# Patient Record
Sex: Female | Born: 1970 | Race: White | Hispanic: No | Marital: Married | Smoking: Never smoker
Health system: Southern US, Community
[De-identification: ages and names within clinical notes are randomized; demographics above are authoritative.]

## PROBLEM LIST (undated history)

## (undated) DIAGNOSIS — E079 Disorder of thyroid, unspecified: Secondary | ICD-10-CM

## (undated) HISTORY — DX: Disorder of thyroid, unspecified: E07.9

## (undated) HISTORY — PX: COLONOSCOPY: SHX174

---

## 2001-02-18 ENCOUNTER — Emergency Department (HOSPITAL_COMMUNITY): Admission: EM | Admit: 2001-02-18 | Discharge: 2001-02-18 | Payer: Self-pay | Admitting: *Deleted

## 2001-02-18 ENCOUNTER — Encounter: Payer: Self-pay | Admitting: *Deleted

## 2002-01-08 ENCOUNTER — Other Ambulatory Visit: Admission: RE | Admit: 2002-01-08 | Discharge: 2002-01-08 | Payer: Self-pay | Admitting: Dermatology

## 2004-02-10 ENCOUNTER — Emergency Department (HOSPITAL_COMMUNITY): Admission: EM | Admit: 2004-02-10 | Discharge: 2004-02-10 | Payer: Self-pay | Admitting: Emergency Medicine

## 2004-02-21 ENCOUNTER — Ambulatory Visit (HOSPITAL_COMMUNITY): Admission: RE | Admit: 2004-02-21 | Discharge: 2004-02-21 | Payer: Self-pay | Admitting: Family Medicine

## 2004-06-05 ENCOUNTER — Ambulatory Visit (HOSPITAL_COMMUNITY): Admission: RE | Admit: 2004-06-05 | Discharge: 2004-06-05 | Payer: Self-pay | Admitting: Family Medicine

## 2004-06-16 ENCOUNTER — Ambulatory Visit (HOSPITAL_COMMUNITY): Admission: RE | Admit: 2004-06-16 | Discharge: 2004-06-16 | Payer: Self-pay | Admitting: Colon and Rectal Surgery

## 2004-06-16 IMAGING — CT CT PARANASAL SINUSES LIMITED
1 series · 8 of 10 positions shown, 10 images · IV contrast (agent unspecified)
Comparison: none

HISTORY: Chronic sinusitis

CT SINUSES LIMITED WITHOUT CONTRAST:
Coronal noncontrast CT images the paranasal sinuses performed at 10 mm
intervals.
Nasal septum midline.
Mild beam hardening artifacts from dental fillings.
Right side of face marked with BB.
Paranasal sinuses clear.
No fracture or bone destruction.

[Series 1610: — · axial · 0.33mm/px · z∈[-615,-545]mm · 8 of 10 slices shown, 10 images]
[im 2/10  brain]
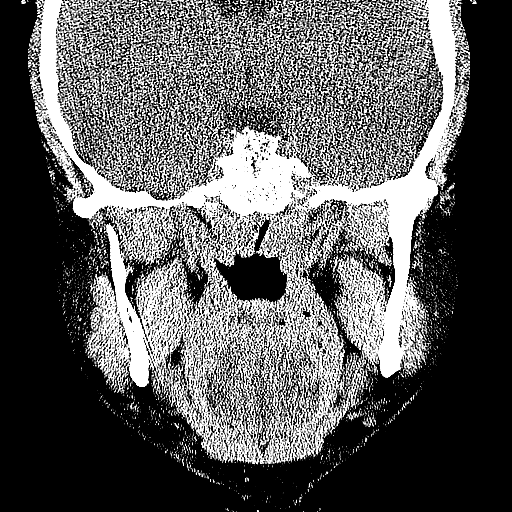
[im 2/10  bone]
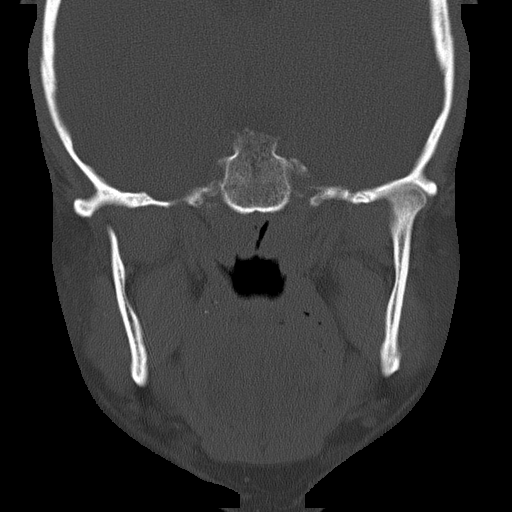
[im 3/10  bone]
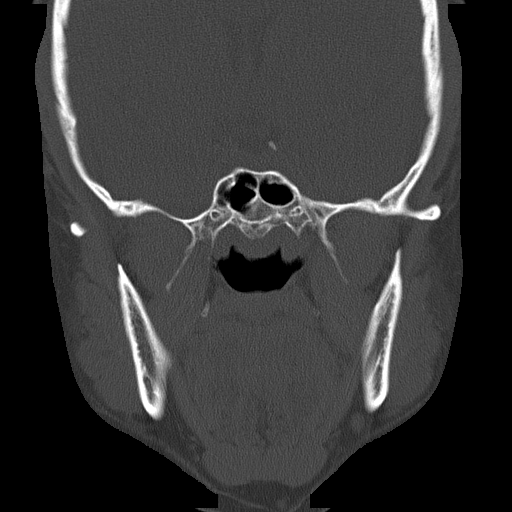
[im 4/10  bone]
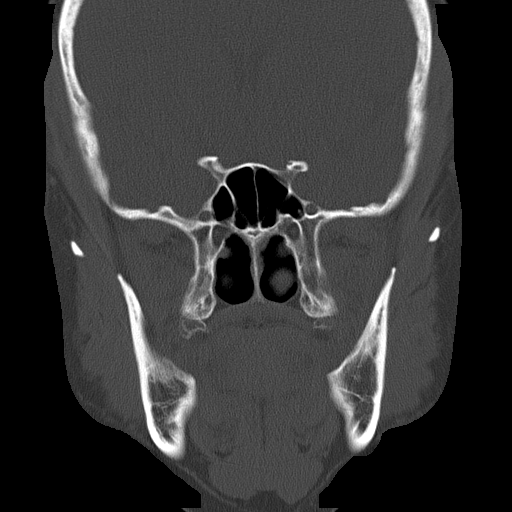
[im 5/10  bone]
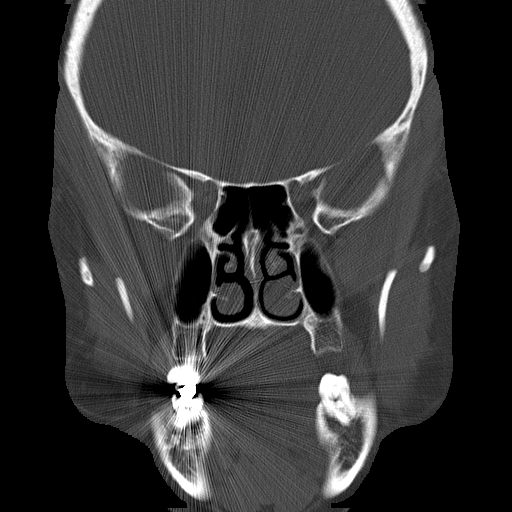
[im 6/10  brain]
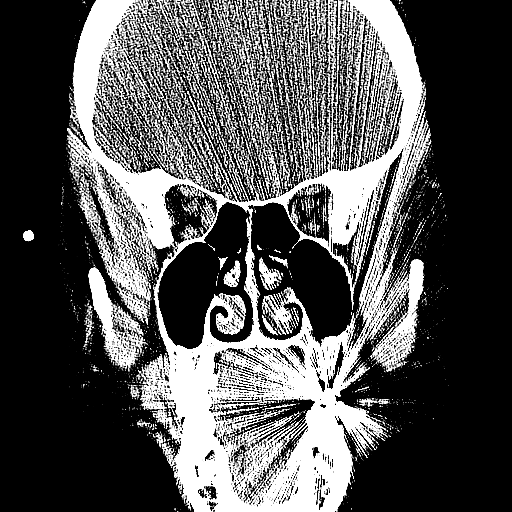
[im 6/10  bone]
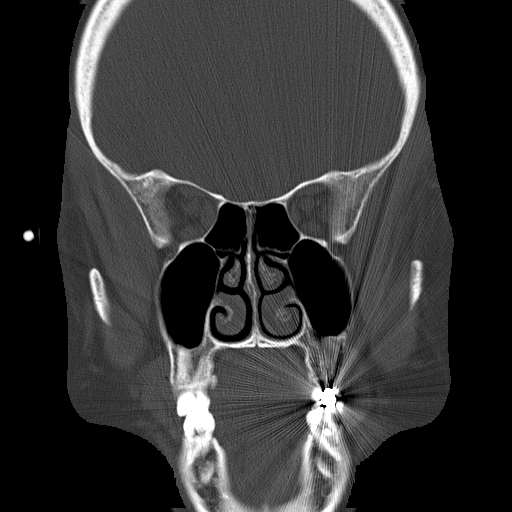
[im 7/10  bone]
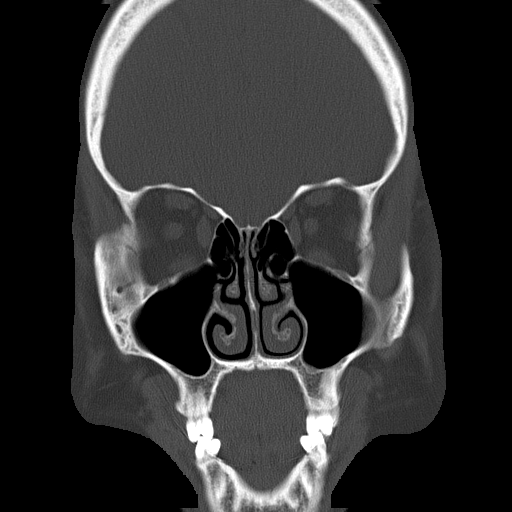
[im 8/10  bone]
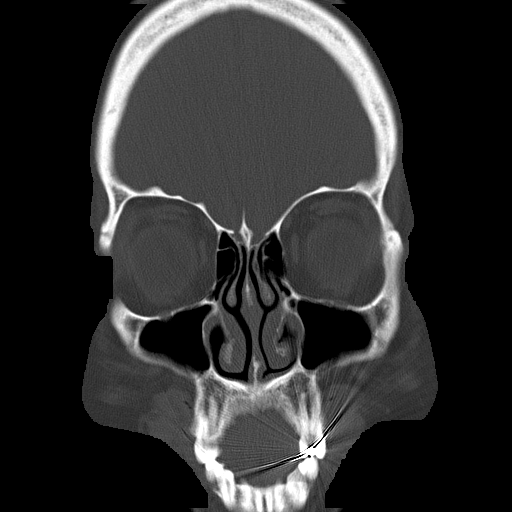
[im 9/10  bone]
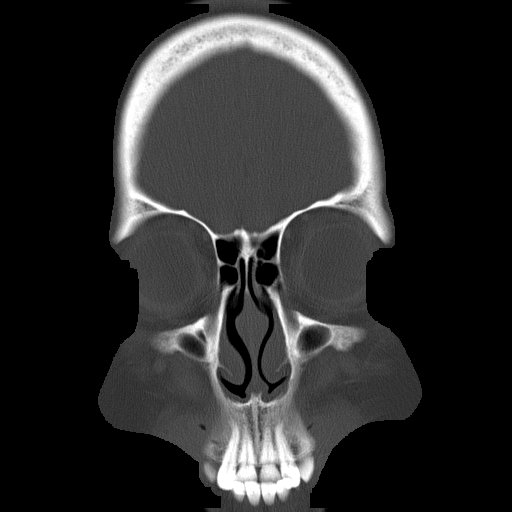

[8 of 10 positions shown; findings below may reference images not displayed]

IMPRESSION: Negative limited CT of the paranasal sinuses.

## 2009-02-17 ENCOUNTER — Ambulatory Visit (HOSPITAL_COMMUNITY): Admission: RE | Admit: 2009-02-17 | Discharge: 2009-02-17 | Payer: Self-pay | Admitting: Family Medicine

## 2009-02-17 IMAGING — CR DG CHEST 2V
2 series · 2 of 2 positions shown · non-contrast
Comparison: [DATE]

CLINICAL DATA: Cough

CHEST - 2 VIEW

[view not recorded (1 of 2)]
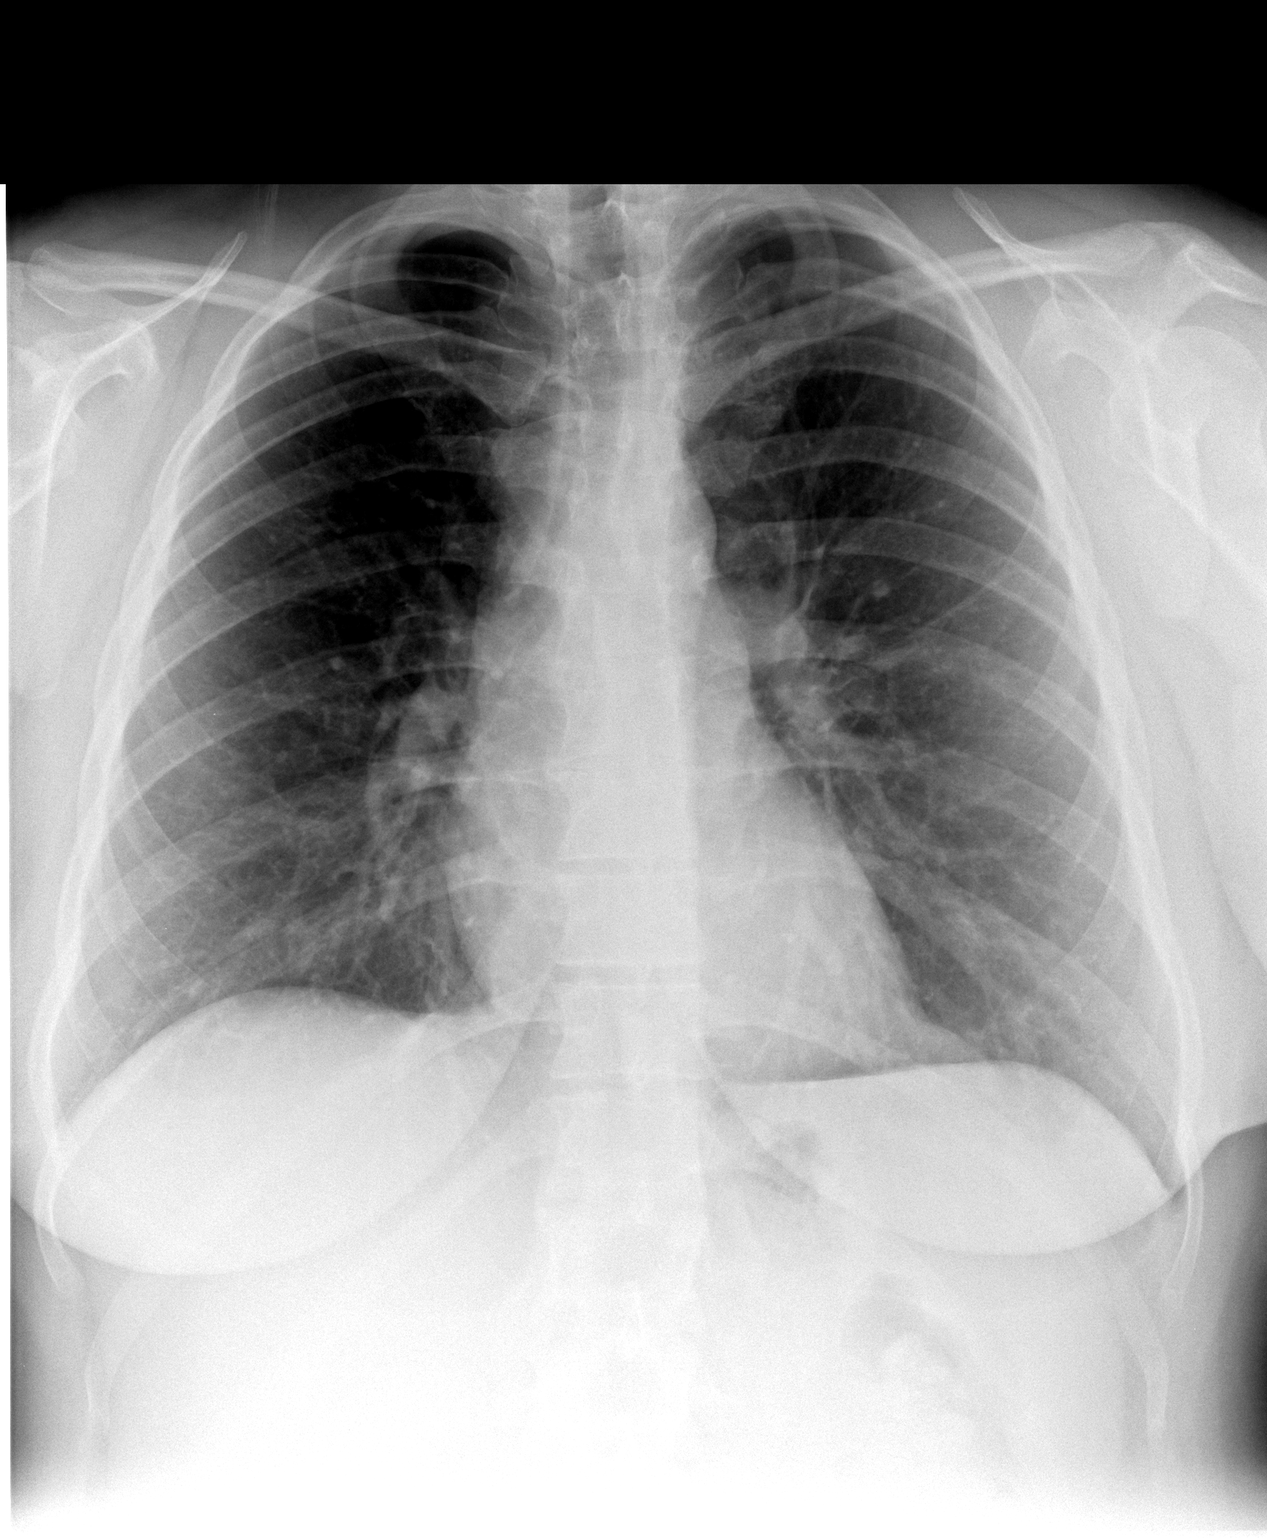

[view not recorded (2 of 2)]
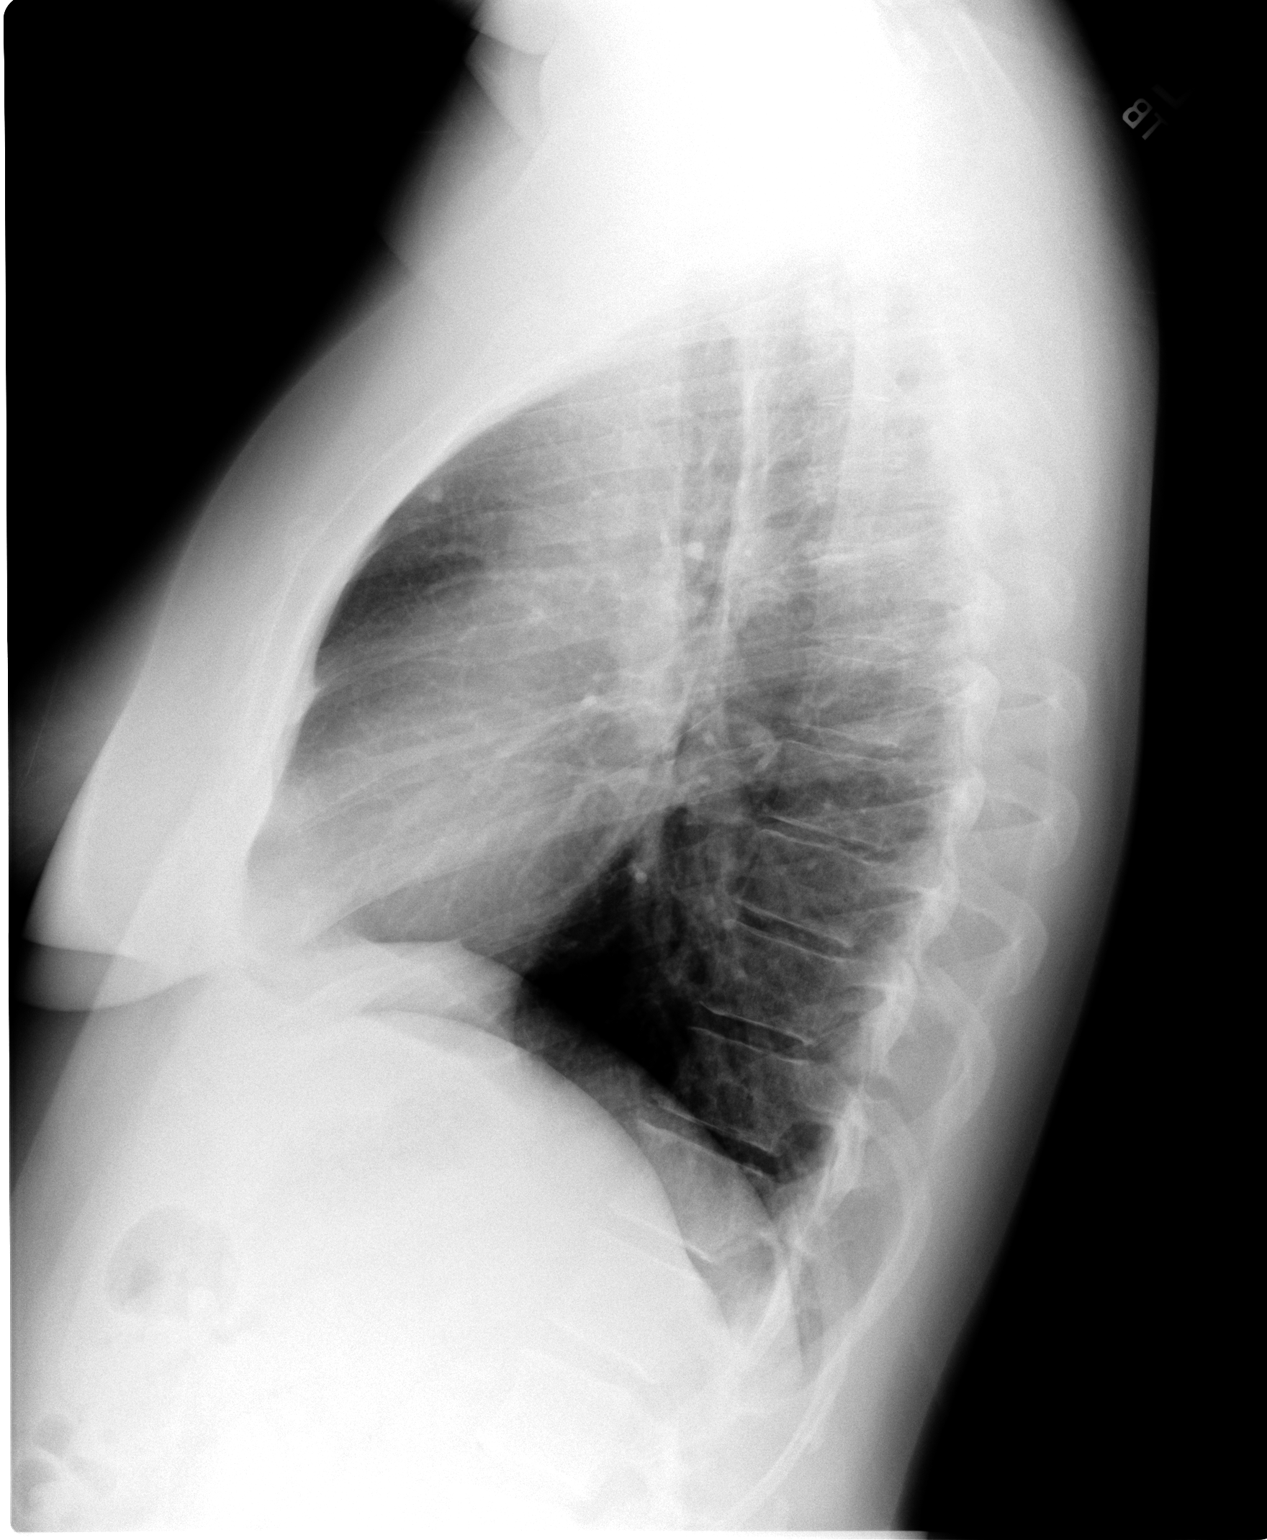

[2 of 2 positions shown; findings below may reference images not displayed]

FINDINGS: No infiltrate consolidation or atelectasis.  Single small
granulomas in the upper lobes bilaterally.  Normal
cardiomediastinal silhouette.
IMPRESSION: No acute chest findings.  Old granulomatous disease.a

## 2010-01-13 ENCOUNTER — Ambulatory Visit (HOSPITAL_COMMUNITY): Admission: RE | Admit: 2010-01-13 | Discharge: 2010-01-13 | Payer: Self-pay | Admitting: Internal Medicine

## 2010-01-13 IMAGING — CR DG HUMERUS 2V *L*
2 series · 2 of 2 positions shown · non-contrast
Comparison: None.

CLINICAL DATA: Fall, arm pain.

LEFT HUMERUS - 2+ VIEW

[view not recorded (1 of 2)]
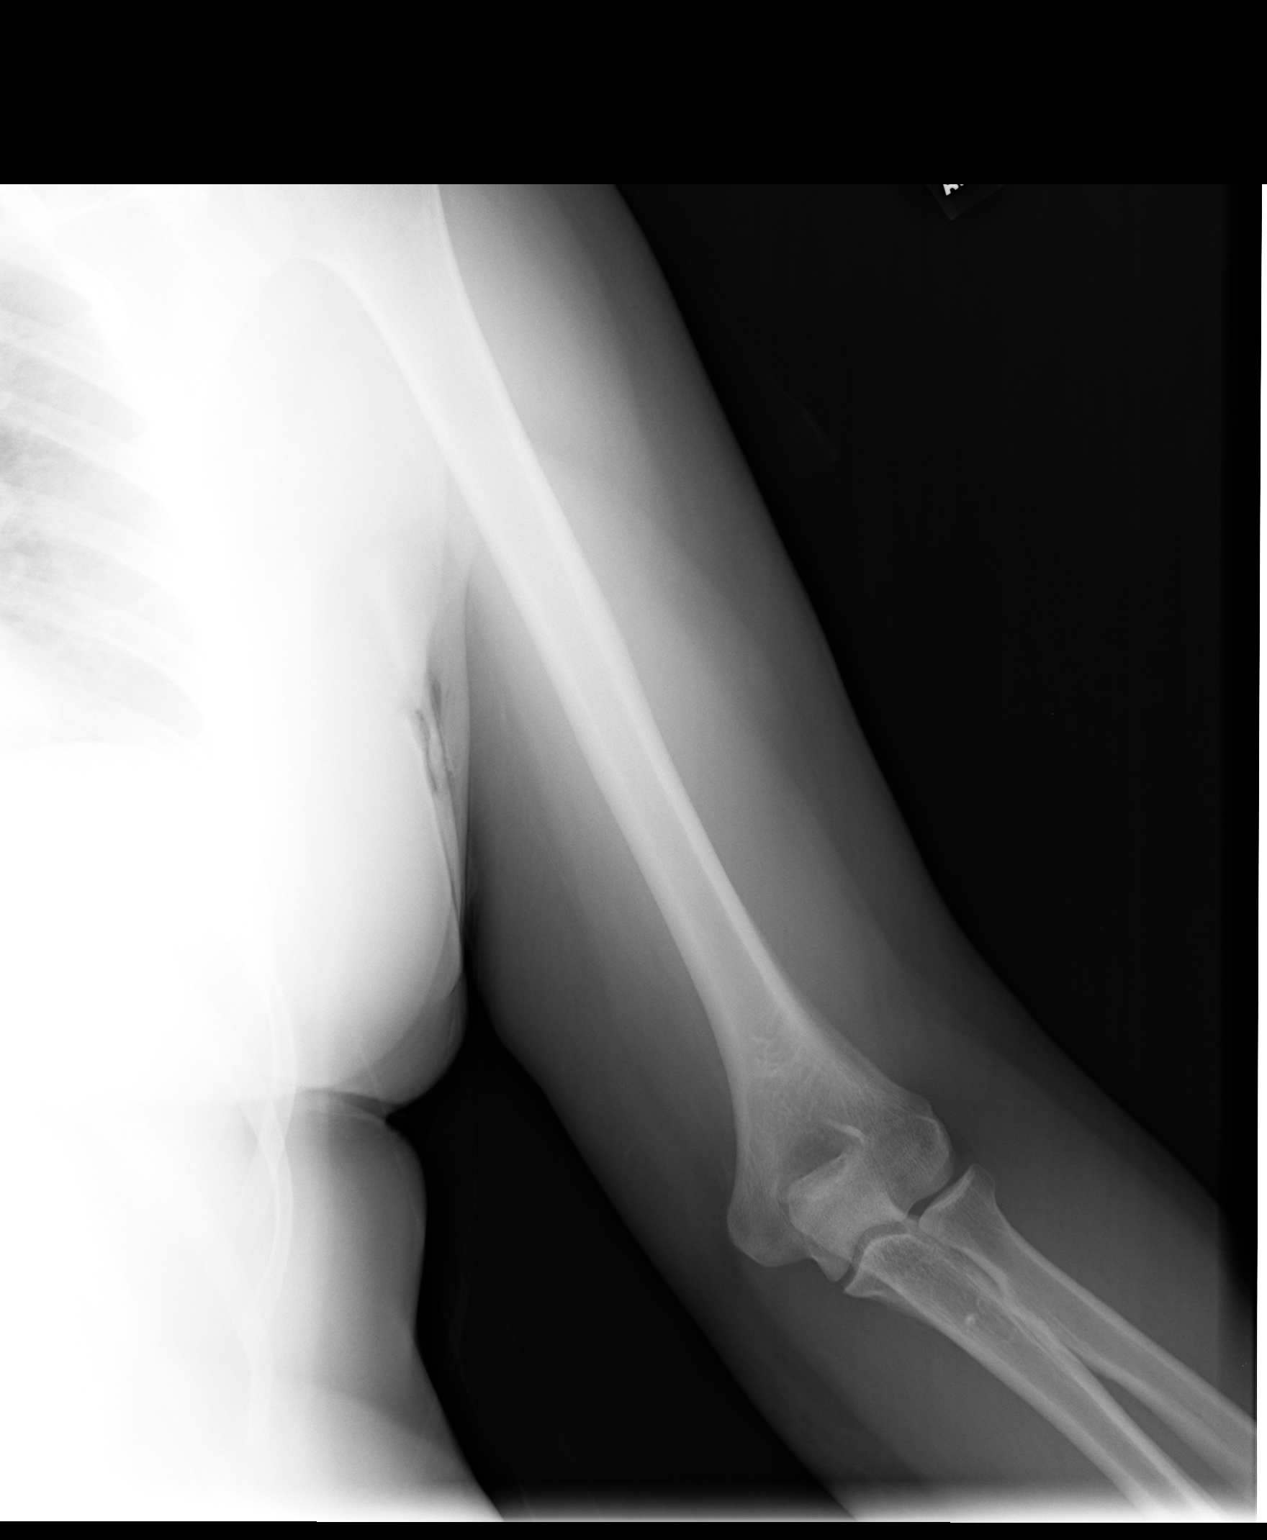

[view not recorded (2 of 2)]
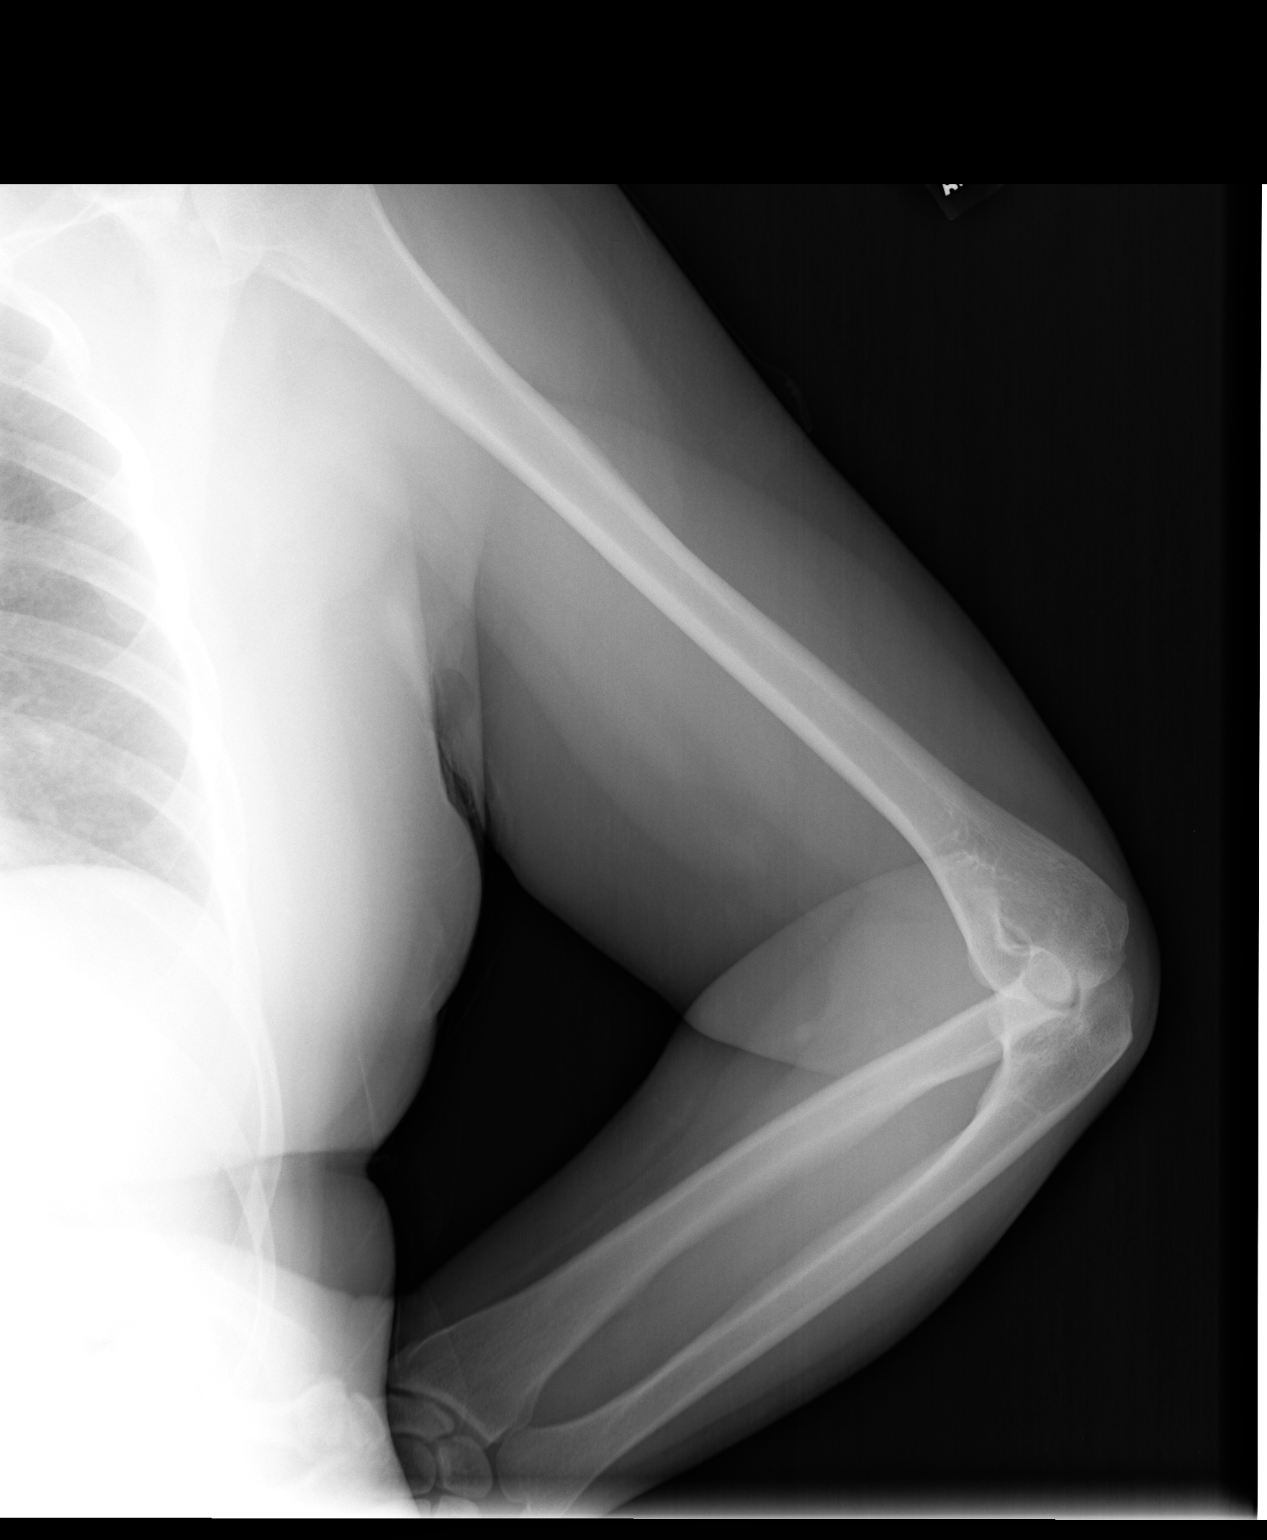

[2 of 2 positions shown; findings below may reference images not displayed]

FINDINGS: No acute bony or joint abnormality is identified.  There
are some calcifications off the greater tuberosity of the humerus
compatible with either calcific tendinopathy or calcific bursitis.
IMPRESSION: 1.  No acute finding.
2.  Findings consistent with calcific rotator cuff tendinopathy
versus calcific bursitis.

## 2018-09-01 DIAGNOSIS — E282 Polycystic ovarian syndrome: Secondary | ICD-10-CM | POA: Insufficient documentation

## 2018-09-01 DIAGNOSIS — Z9189 Other specified personal risk factors, not elsewhere classified: Secondary | ICD-10-CM | POA: Insufficient documentation

## 2018-09-03 ENCOUNTER — Other Ambulatory Visit: Payer: Self-pay | Admitting: Obstetrics and Gynecology

## 2018-09-03 DIAGNOSIS — R928 Other abnormal and inconclusive findings on diagnostic imaging of breast: Secondary | ICD-10-CM

## 2018-09-12 ENCOUNTER — Other Ambulatory Visit: Payer: Self-pay

## 2018-09-12 ENCOUNTER — Ambulatory Visit
Admission: RE | Admit: 2018-09-12 | Discharge: 2018-09-12 | Disposition: A | Discharge status: Home / Self Care | Payer: 59 | Source: Ambulatory Visit | Attending: Obstetrics and Gynecology | Admitting: Obstetrics and Gynecology

## 2018-09-12 DIAGNOSIS — R928 Other abnormal and inconclusive findings on diagnostic imaging of breast: Secondary | ICD-10-CM

## 2018-09-12 IMAGING — US ULTRASOUND LEFT BREAST LIMITED
1 series · 9 of 9 positions shown · non-contrast
Comparison: Previous exam(s).

CLINICAL DATA: The patient was called back for a left breast mass.

EXAM:
DIGITAL DIAGNOSTIC LEFT MAMMOGRAM WITH TOMO
ULTRASOUND LEFT BREAST

[Series 1: ultrasound left breast limited · 0.06mm/px · 9 of 9 slices shown]
[im 1/9]
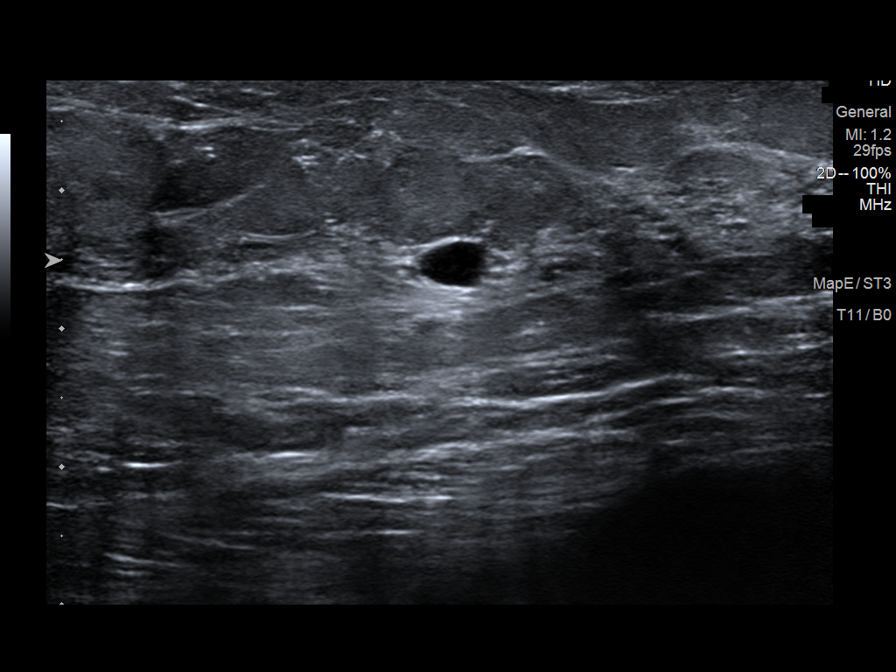
[im 2/9]
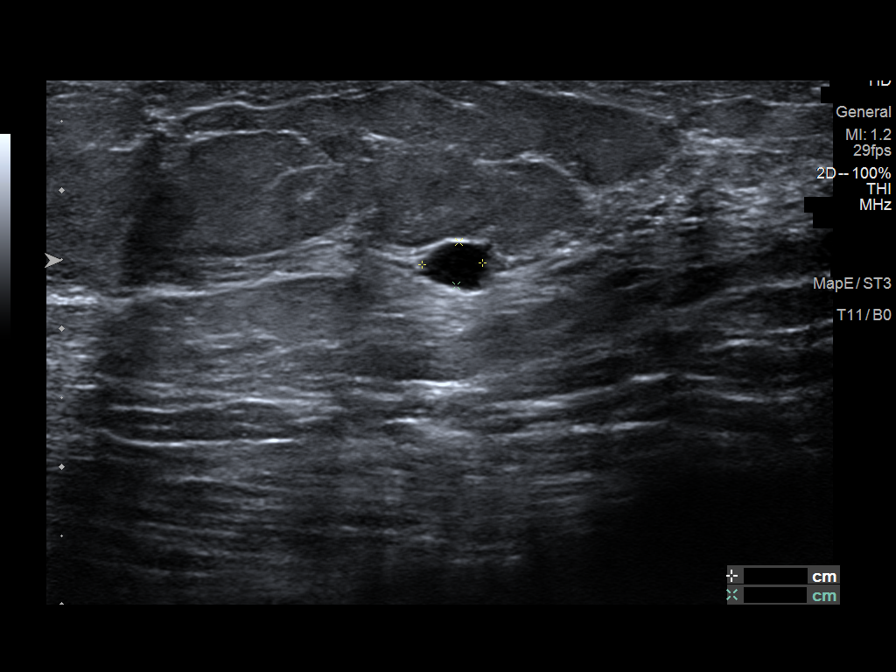
[im 3/9]
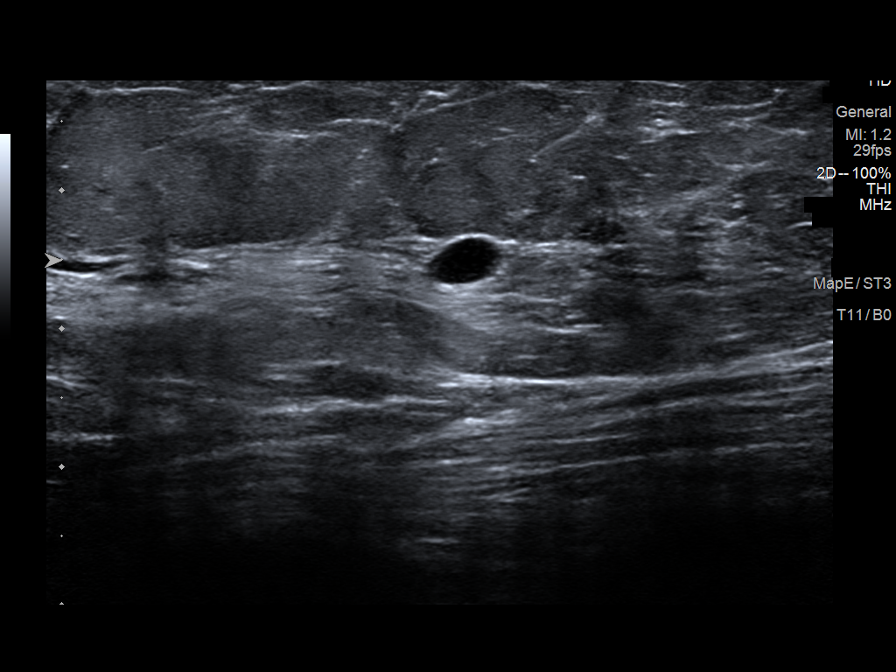
[im 4/9]
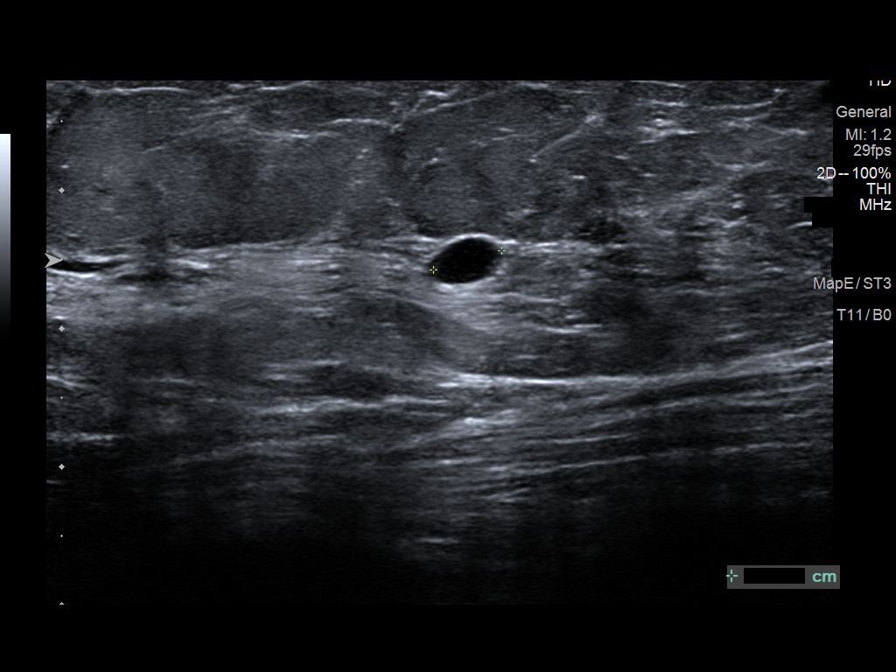
[im 5/9]
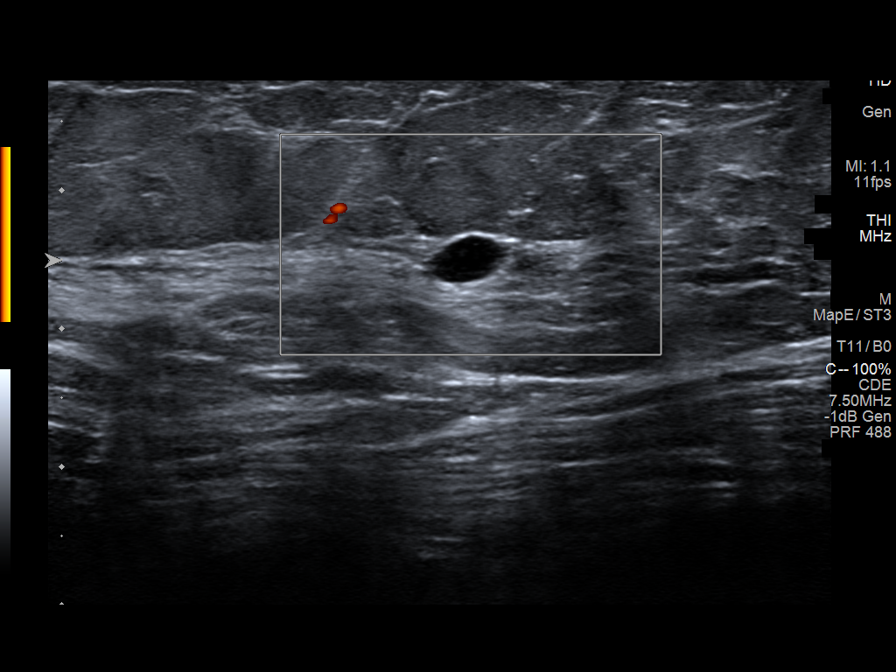
[im 6/9]
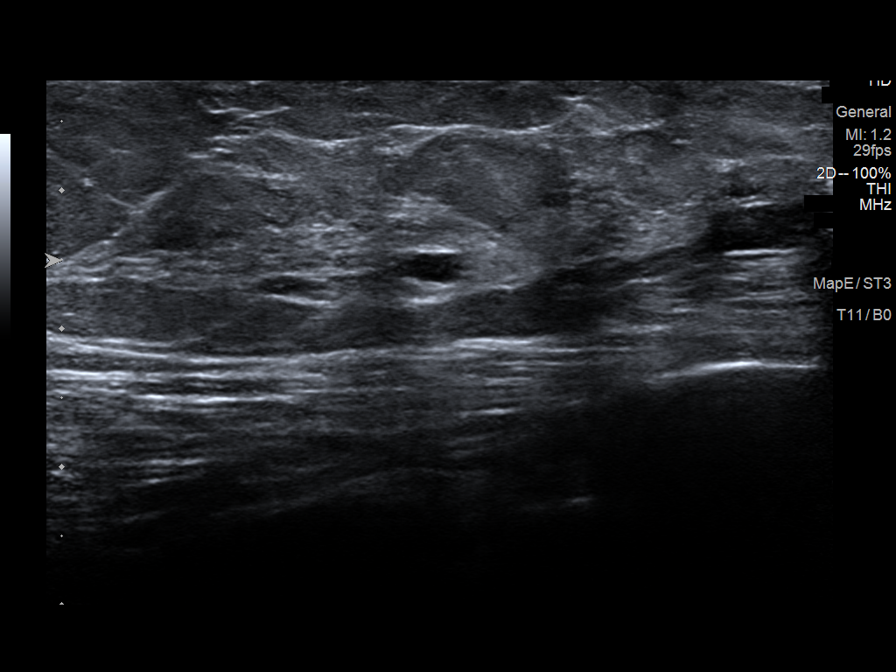
[im 7/9]
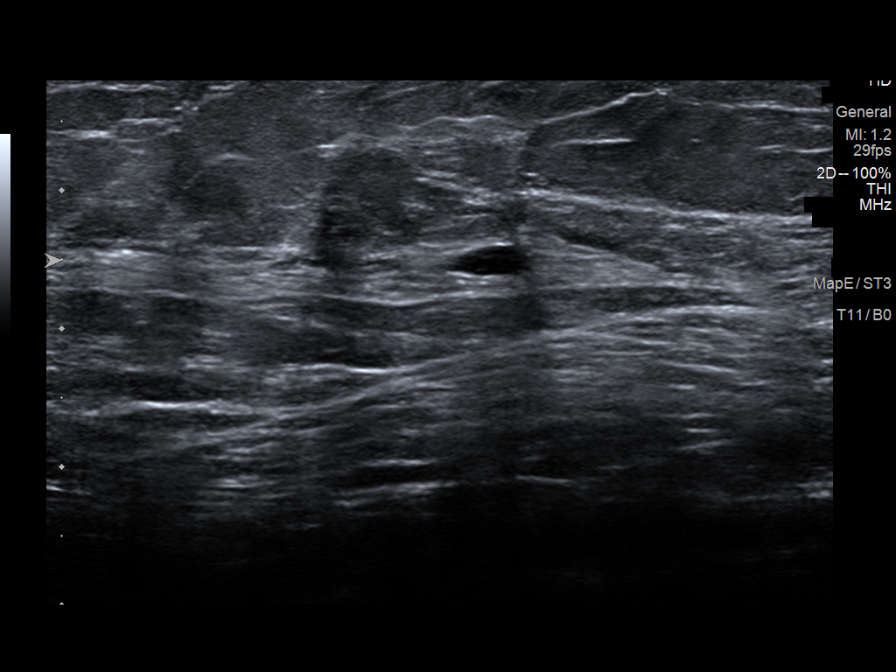
[im 8/9]
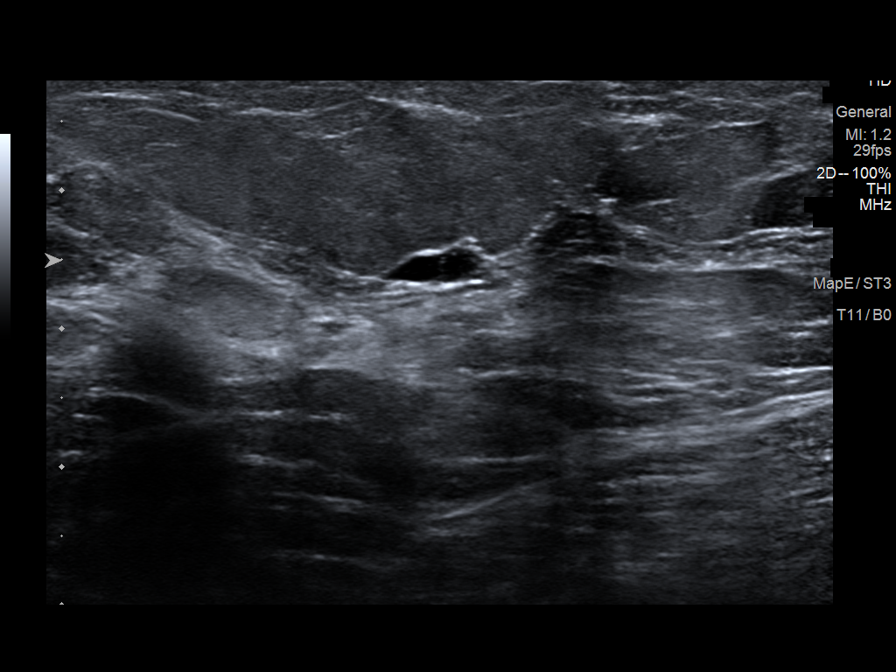
[im 9/9]
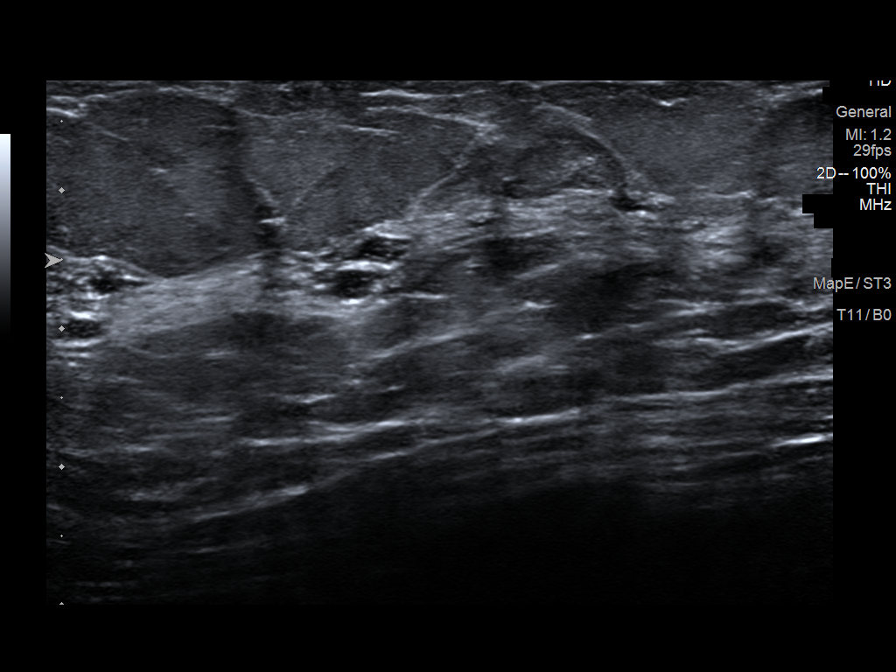

[9 of 9 positions shown; findings below may reference images not displayed]

ACR Breast Density Category b: There are scattered areas of
fibroglandular density.
FINDINGS: The left breast mass is less conspicuous but does persist on today's
imaging.

On physical exam, no suspicious lumps are identified.

Targeted ultrasound is performed, showing numerous cysts in the
upper outer left breast accounting for the mammographically
identified mass.
IMPRESSION: Fibrocystic changes.  No evidence of malignancy.

RECOMMENDATION:
Annual screening mammography. Additionally, the patient's sister was
recently diagnosed with breast cancer and was noted to be
genetically positive. The patient has had genetic testing and is
awaiting results. If the patient is genetically positive for a
breast cancer mutation, recommend annual breast MRI in addition to
annual mammography. If the patient is negative for a genetic
mutation, recommend assessing the patient's lifetime risk of breast
cancer. If her lifetime risk of breast cancer is greater than 20%,
recommend annual breast MRI in addition to annual mammography.

I have discussed the findings and recommendations with the patient.
Results were also provided in writing at the conclusion of the
visit. If applicable, a reminder letter will be sent to the patient
regarding the next appointment.

BI-RADS CATEGORY  2: Benign.

## 2018-09-12 IMAGING — MG DIGITAL DIAGNOSTIC UNILATERAL LEFT MAMMOGRAM WITH TOMO AND CAD
4 series · 4 of 12 positions shown · non-contrast
Comparison: Previous exam(s).

CLINICAL DATA: The patient was called back for a left breast mass.

EXAM:
DIGITAL DIAGNOSTIC LEFT MAMMOGRAM WITH TOMO
ULTRASOUND LEFT BREAST

[L CC synth-2D]
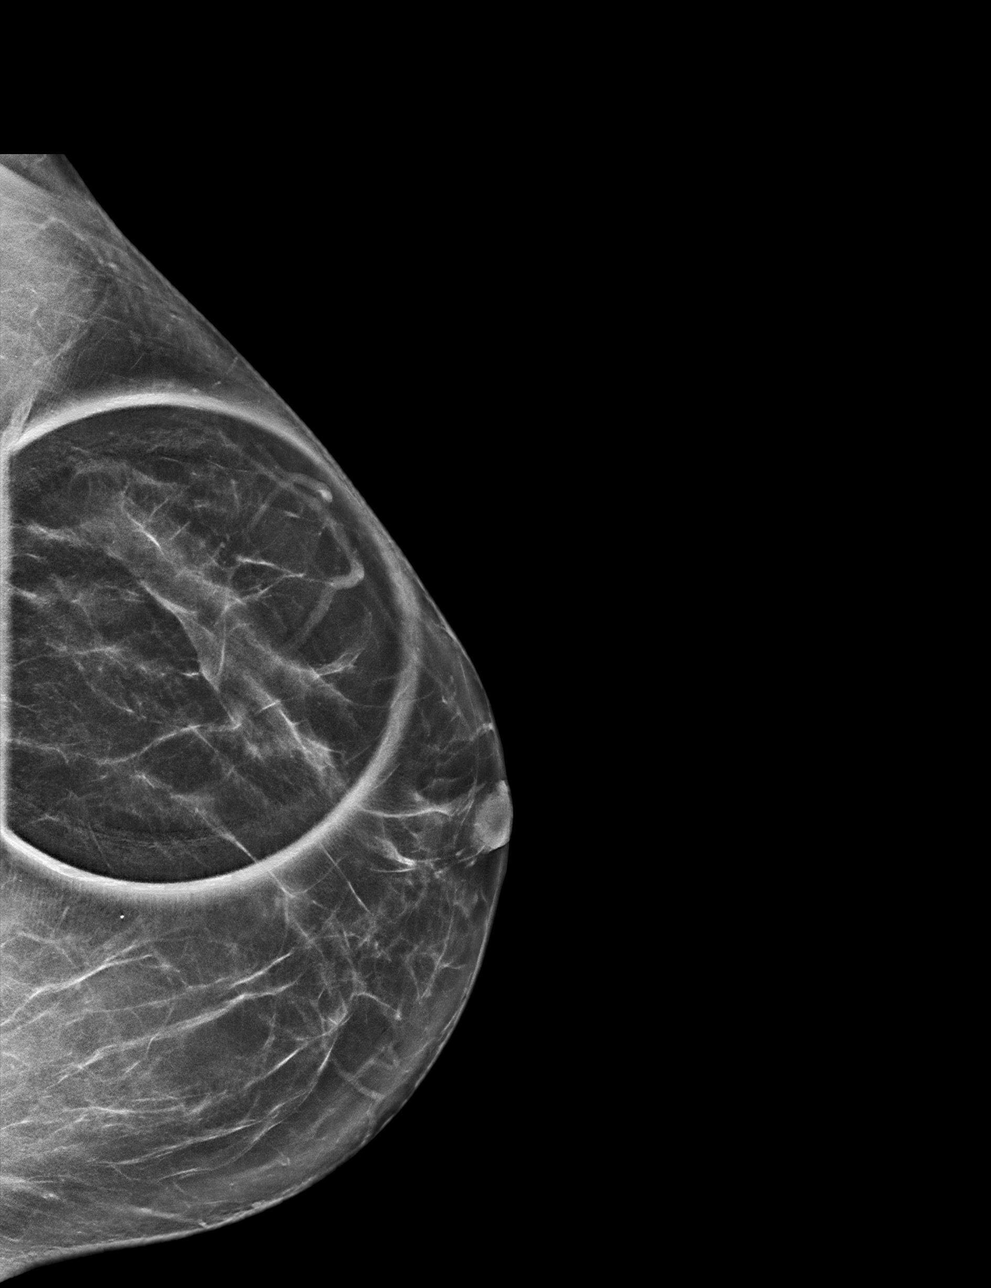

[L MLO synth-2D]
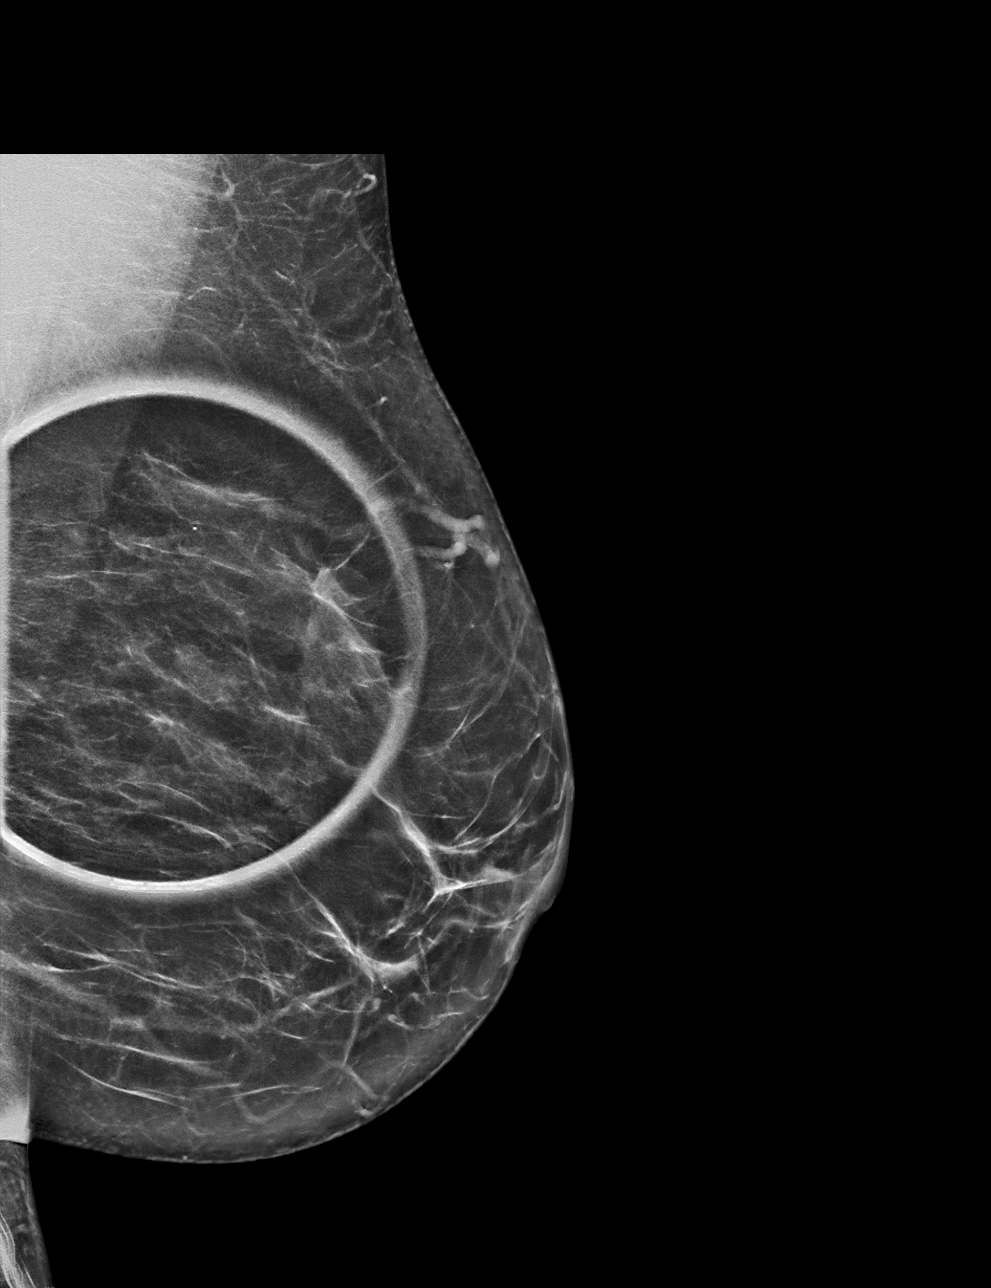

[L MLO tomo · tomo slice 37/73.0]
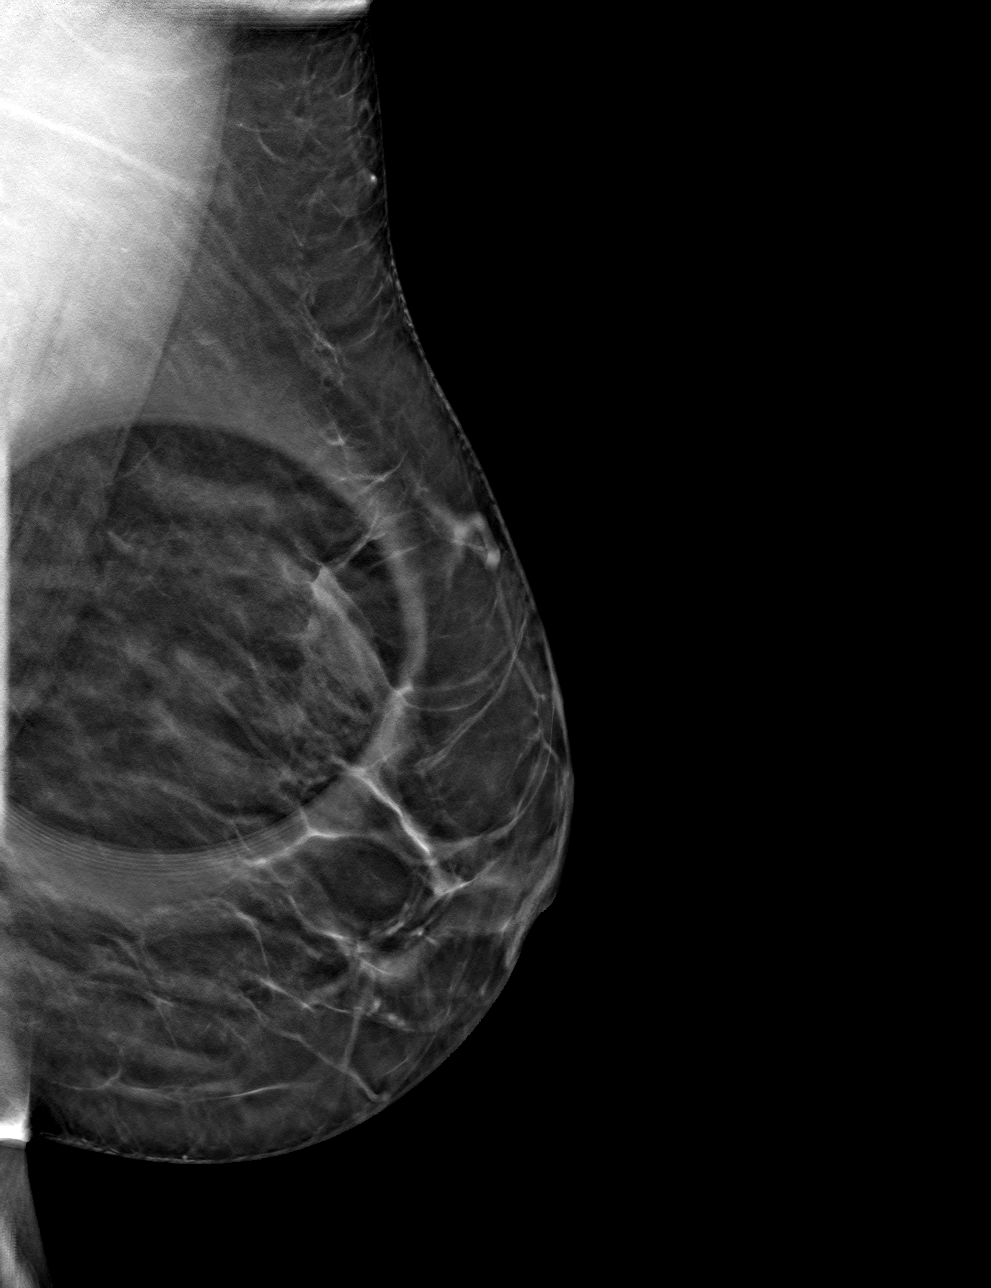

[L CC tomo · tomo slice 31/62.0]
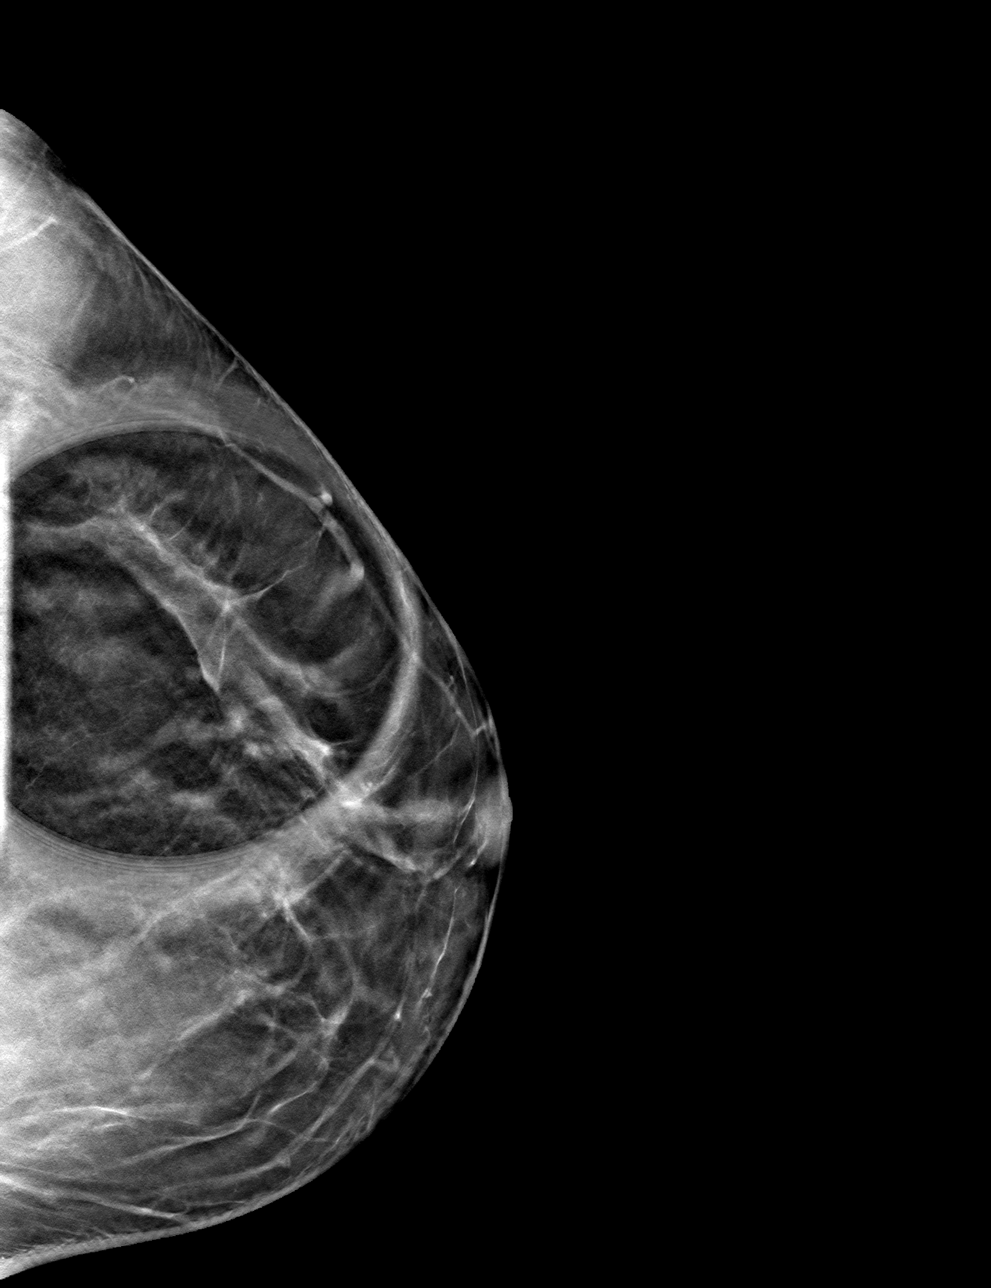

[4 of 12 positions shown; findings below may reference images not displayed]

ACR Breast Density Category b: There are scattered areas of
fibroglandular density.
FINDINGS: The left breast mass is less conspicuous but does persist on today's
imaging.

On physical exam, no suspicious lumps are identified.

Targeted ultrasound is performed, showing numerous cysts in the
upper outer left breast accounting for the mammographically
identified mass.
IMPRESSION: Fibrocystic changes.  No evidence of malignancy.

RECOMMENDATION:
Annual screening mammography. Additionally, the patient's sister was
recently diagnosed with breast cancer and was noted to be
genetically positive. The patient has had genetic testing and is
awaiting results. If the patient is genetically positive for a
breast cancer mutation, recommend annual breast MRI in addition to
annual mammography. If the patient is negative for a genetic
mutation, recommend assessing the patient's lifetime risk of breast
cancer. If her lifetime risk of breast cancer is greater than 20%,
recommend annual breast MRI in addition to annual mammography.

I have discussed the findings and recommendations with the patient.
Results were also provided in writing at the conclusion of the
visit. If applicable, a reminder letter will be sent to the patient
regarding the next appointment.

BI-RADS CATEGORY  2: Benign.

## 2018-09-16 DIAGNOSIS — N859 Noninflammatory disorder of uterus, unspecified: Secondary | ICD-10-CM | POA: Insufficient documentation

## 2018-10-23 ENCOUNTER — Other Ambulatory Visit: Payer: Self-pay | Admitting: Obstetrics and Gynecology

## 2018-10-23 DIAGNOSIS — Z9189 Other specified personal risk factors, not elsewhere classified: Secondary | ICD-10-CM

## 2019-01-16 ENCOUNTER — Other Ambulatory Visit: Payer: Self-pay

## 2019-01-16 ENCOUNTER — Ambulatory Visit
Admission: RE | Admit: 2019-01-16 | Discharge: 2019-01-16 | Disposition: A | Discharge status: Home / Self Care | Payer: 59 | Source: Ambulatory Visit | Attending: Obstetrics and Gynecology | Admitting: Obstetrics and Gynecology

## 2019-01-16 DIAGNOSIS — Z9189 Other specified personal risk factors, not elsewhere classified: Secondary | ICD-10-CM

## 2019-01-16 IMAGING — MR MR BREAST BILAT WO/W CM
8 of 12 series · 32 of 48 positions shown · IV contrast (8 ml gadavist)
Comparison: Previous exam(s).

CLINICAL DATA: 47-year-old female with a strong family history of
breast cancer. Patient's sister was diagnosed at the age of 47 and
is genetically positive and paternal aunt at the age of 71. The
patient had genetic testing and is awaiting results. Patient was
called back from a recent screening mammogram performed on
[DATE] and diagnostic workup was performed revealing cysts in
the lateral breast.

LABS:  None obtained at the time of imaging.
EXAM:
BILATERAL BREAST MRI WITH AND WITHOUT CONTRAST
TECHNIQUE: Multiplanar, multisequence MR images of both breasts were obtained
prior to and following the intravenous administration of 8 ml of
Gadavist

[Series 2: t2_tirm_tra ipat (a-p) · axial · 3.0mm · 0.70mm/px · 1 of 55 slices shown]
[im 1/55]
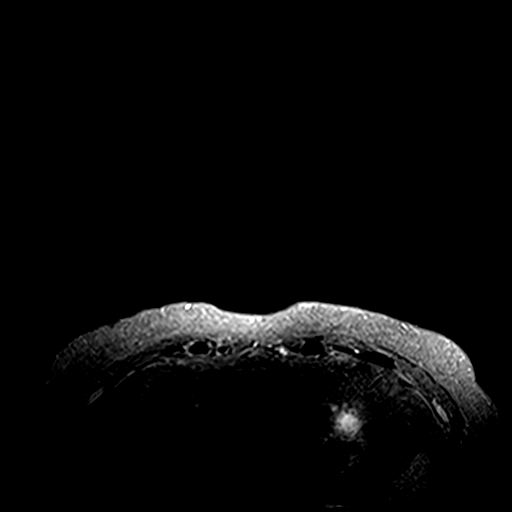

[Series 3: fl3d pre-cm no · axial · non-contrast · 1.2mm · 0.94mm/px · z∈[-57,+115]mm · 5 of 144 slices shown]
[im 1/144]
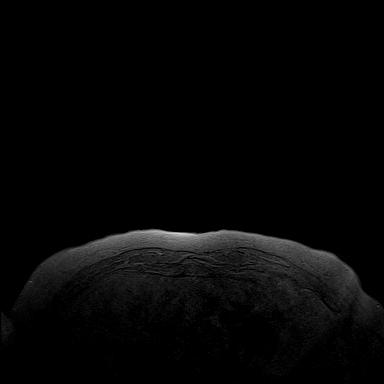
[im 36/144]
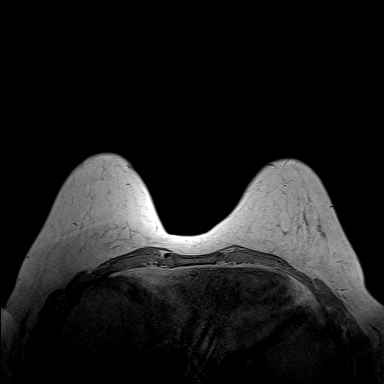
[im 72/144]
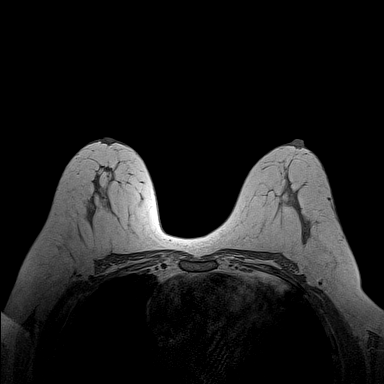
[im 108/144]
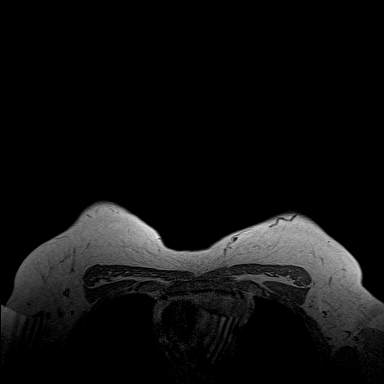
[im 144/144]
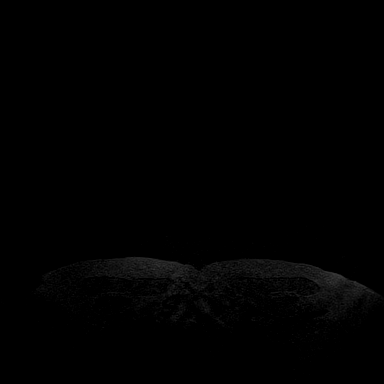

[Series 4: fl3d pre-cm · axial · non-contrast · 1.2mm · 0.94mm/px · z∈[-57,+115]mm · 5 of 144 slices shown]
[im 1/144]
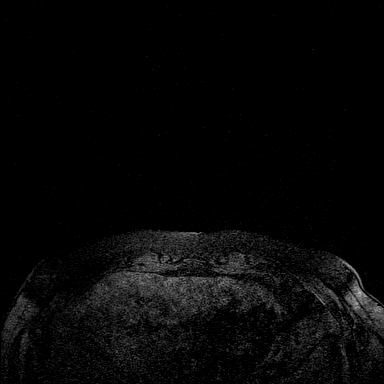
[im 36/144]
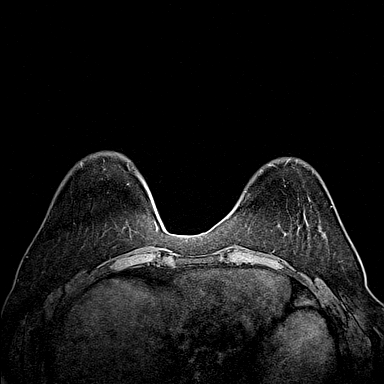
[im 72/144]
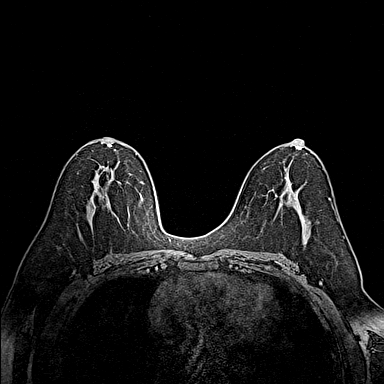
[im 108/144]
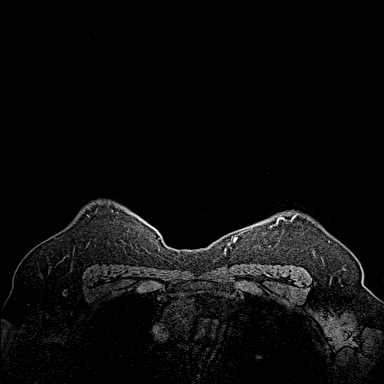
[im 144/144]
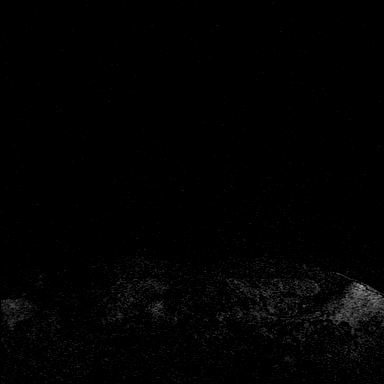

[Series 5: fl3d post-cm 20 · axial · 1.2mm · 0.94mm/px · z∈[-57,+115]mm · 5 of 144 slices shown (1 of 3)]
[im 1/144]
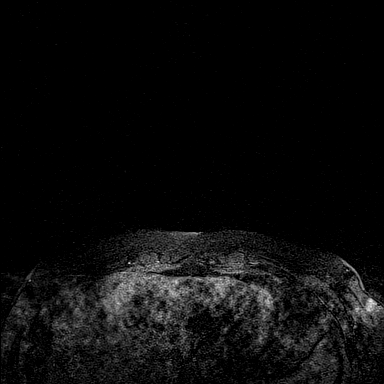
[im 36/144]
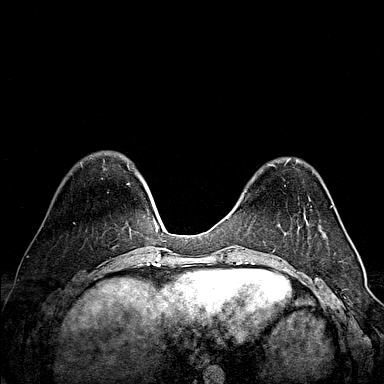
[im 72/144]
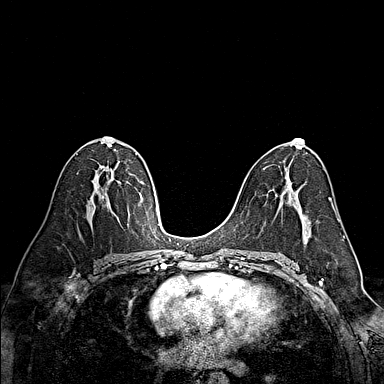
[im 108/144]
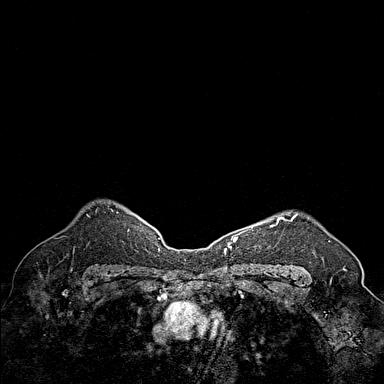
[im 144/144]
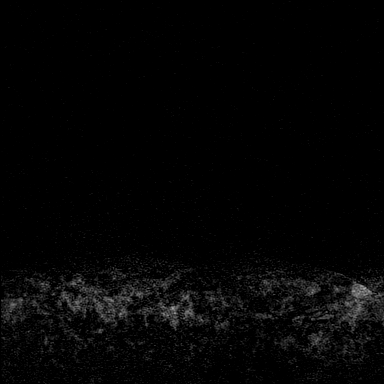

[Series 6: fl3d post-cm 20 · axial · 1.2mm · 0.94mm/px · z∈[-57,+115]mm · 5 of 144 slices shown (2 of 3)]
[im 1/144]
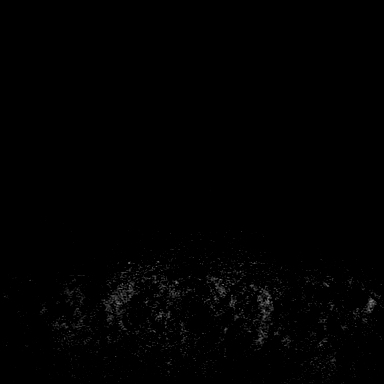
[im 36/144]
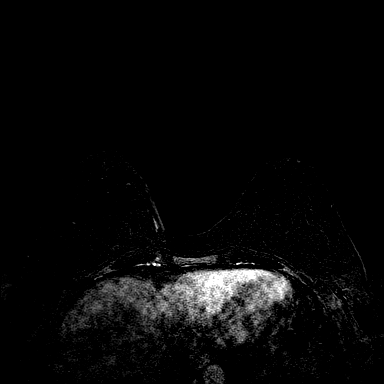
[im 72/144]
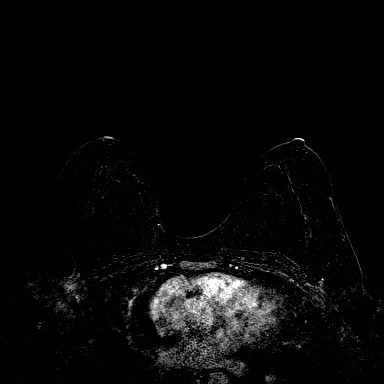
[im 108/144]
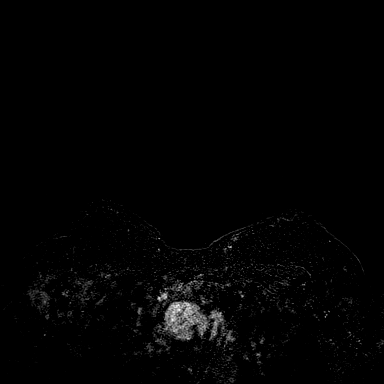
[im 144/144]
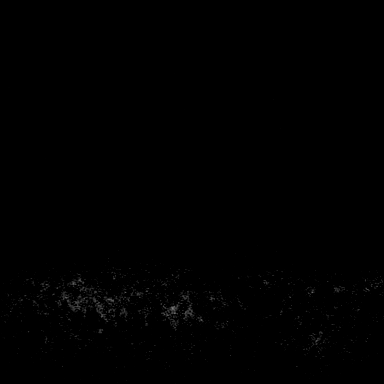

[Series 7: fl3d post-cm 20 · axial · 172.8mm · 0.94mm/px · 1 of 1 slices shown (3 of 3)]
[im 1/1]
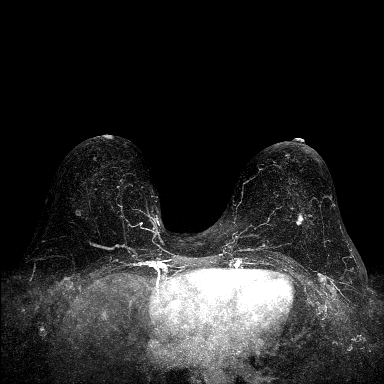

[Series 8: fl3d post-cm 3min · axial · 1.2mm · 0.94mm/px · z∈[-57,+115]mm · 6 of 144 slices shown]
[im 1/144]
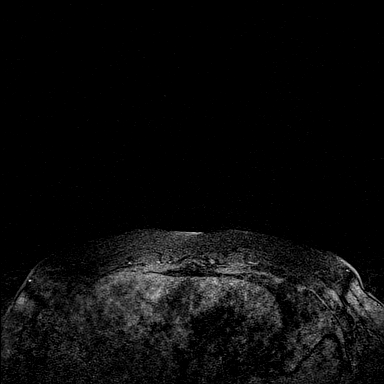
[im 29/144]
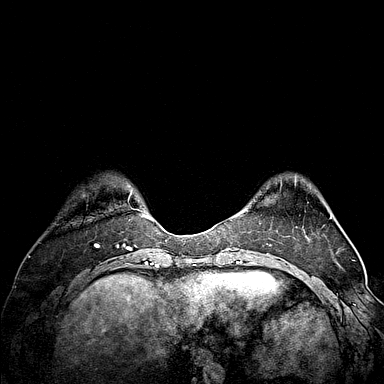
[im 58/144]
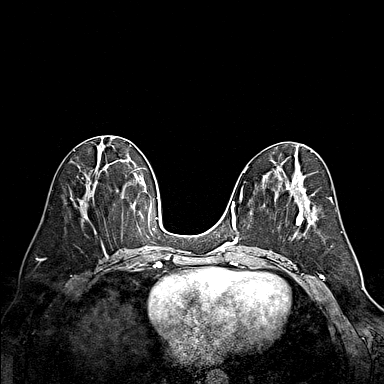
[im 86/144]
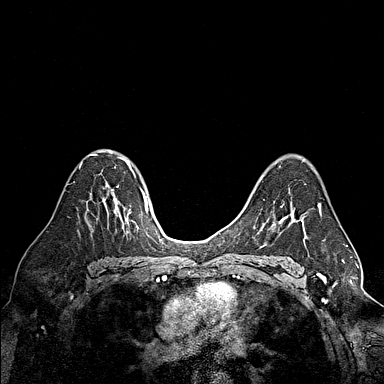
[im 115/144]
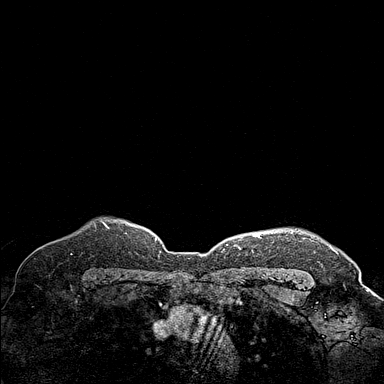
[im 144/144]
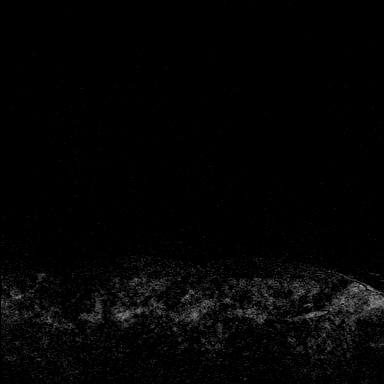

[Series 9: fl3d post-cm 3min_sub · axial · 1.2mm · 0.94mm/px · z∈[-57,+45]mm · 4 of 144 slices shown]
[im 1/144]
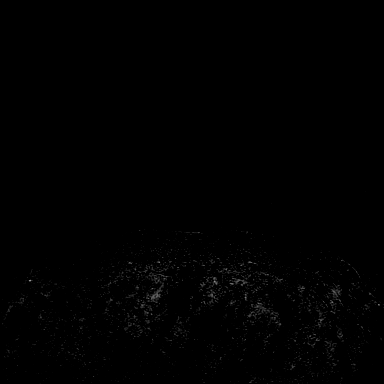
[im 29/144]
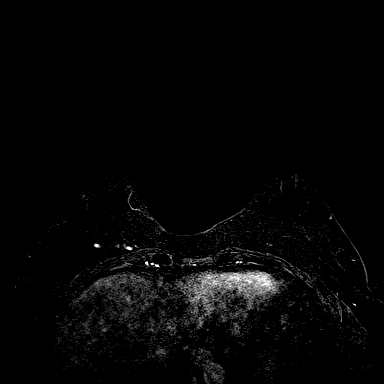
[im 58/144]
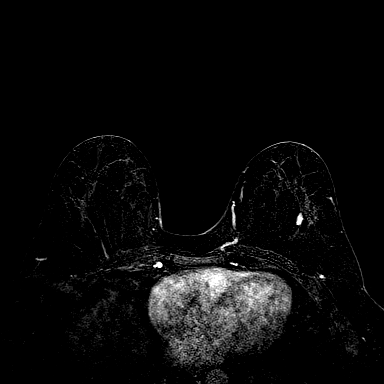
[im 86/144]
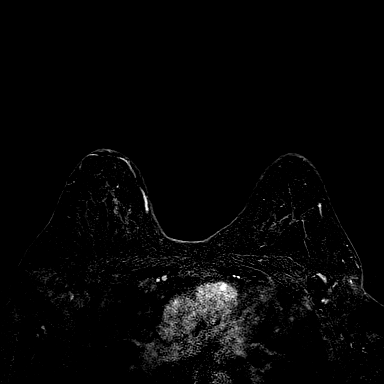

[32 of 48 positions shown; findings below may reference images not displayed]

Three-dimensional MR images were rendered by post-processing of the
original MR data on an independent workstation. The
three-dimensional MR images were interpreted, and findings are
reported in the following complete MRI report for this study. Three
dimensional images were evaluated at the independent [employer]
workstation
FINDINGS: Breast composition: b. Scattered fibroglandular tissue.

Background parenchymal enhancement: Moderate.

Right breast:

1. There is 7 mm enhancing mass in the middle third of the lower
outer quadrant of the right breast (series 9, image 93).

2. There is a well-circumscribed 4 mm indeterminate nodule in the
anterior third of the lower outer quadrant of the right breast
(series 9, image 94).

Left breast:

1. There is a 1.3 cm enhancing mass in the middle third of the lower
outer quadrant of the left breast (series 9, image 90).

2. There is a 4 mm enhancing mass in the middle third of the lower
outer quadrant of the left breast (series 9, image 99) with
associated smaller enhancing nodularity, medially.

3. There is a 4 mm enhancing mass in the anterior third of the lower
inner quadrant of the left breast (series 9, image 90).

Lymph nodes: No abnormal appearing lymph nodes.

Ancillary findings:  None.
IMPRESSION: Indeterminate enhancing masses in both breast.

RECOMMENDATION:
Recommend MR guided core biopsies of the larger masses in each
breast (the 1.3 cm mass in the lower outer quadrant of the left
breast seen on series 9, image 90 and the 7 mm enhancing mass in the
lower outer quadrant of the right breast seen on series 9, image 93.

If the MR guided core biopsies are benign short-term interval
follow-up breast MRI in 6 months is recommended. If either of the
masses are malignant consider MR guided core biopsies of other
enhancing masses in both breast.

BI-RADS CATEGORY  4: Suspicious.

## 2019-01-16 MED ORDER — GADOBUTROL 1 MMOL/ML IV SOLN
8.0000 mL | Freq: Once | INTRAVENOUS | Status: AC | PRN
  Administered 2019-01-16: 12:00:00 8 mL via INTRAVENOUS

## 2019-01-19 ENCOUNTER — Other Ambulatory Visit: Payer: Self-pay | Admitting: Obstetrics and Gynecology

## 2019-01-19 DIAGNOSIS — R9389 Abnormal findings on diagnostic imaging of other specified body structures: Secondary | ICD-10-CM

## 2019-01-20 DIAGNOSIS — R928 Other abnormal and inconclusive findings on diagnostic imaging of breast: Secondary | ICD-10-CM | POA: Insufficient documentation

## 2019-01-27 ENCOUNTER — Other Ambulatory Visit: Payer: Self-pay

## 2019-01-27 ENCOUNTER — Ambulatory Visit
Admission: RE | Admit: 2019-01-27 | Discharge: 2019-01-27 | Disposition: A | Discharge status: Home / Self Care | Payer: 59 | Source: Ambulatory Visit | Attending: Obstetrics and Gynecology | Admitting: Obstetrics and Gynecology

## 2019-01-27 DIAGNOSIS — R9389 Abnormal findings on diagnostic imaging of other specified body structures: Secondary | ICD-10-CM

## 2019-01-27 IMAGING — MR MR BREAST BX W/ LOC DEV 1ST LEASION IMAGE BX SPEC MR GUIDE*R*
4 of 8 series · 24 of 48 positions shown · IV contrast (7 ml Gadavist)
Comparison: [DATE]
COMPARISON: [DATE]
COMPARISON: [DATE]

Addendum:
CLINICAL DATA: Patient presents for MR guided core biopsy of a mass
in each breast. Recent MRI was performed for screening secondary to
[[place]] gene. The patient also has a sister with recent diagnosis of
breast cancer at age 47, also positive for the [[place]] gene

EXAM:
MRI GUIDED CORE NEEDLE BIOPSY OF THE BILATERAL BREASTS
TECHNIQUE: Multiplanar, multisequence MR imaging of the bilateral breasts was
performed both before and after administration of intravenous
contrast.
CONTRAST:  7mL GADAVIST GADOBUTROL 1 MMOL/ML IV SOLN

[Series 2: fiducial bilateral · sagittal · 2.0mm · 1.33mm/px · 6 of 144 slices shown]
[im 1/144]
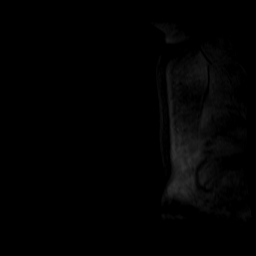
[im 29/144]
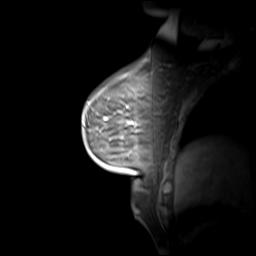
[im 58/144]
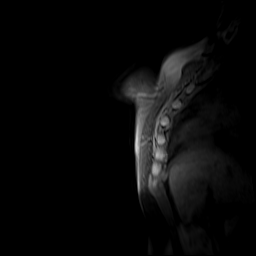
[im 86/144]
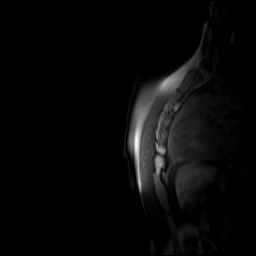
[im 115/144]
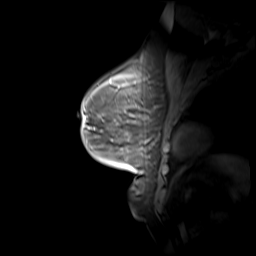
[im 144/144]
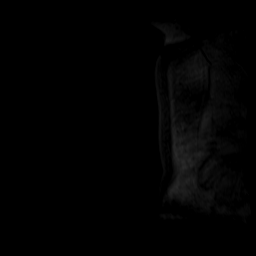

[Series 3: dynamic pre · axial · non-contrast · 1.3mm · 0.73mm/px · z∈[-65,+121]mm · 6 of 144 slices shown]
[im 1/144]
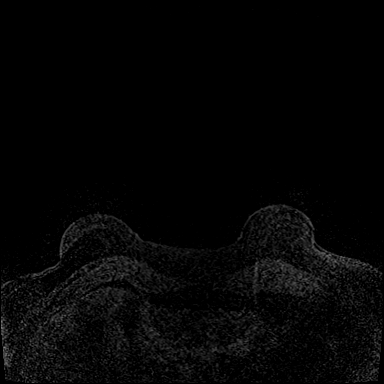
[im 29/144]
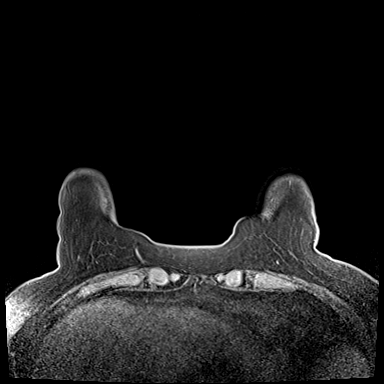
[im 58/144]
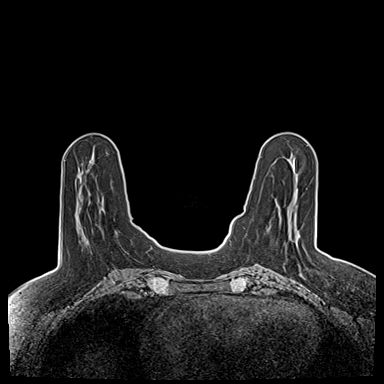
[im 86/144]
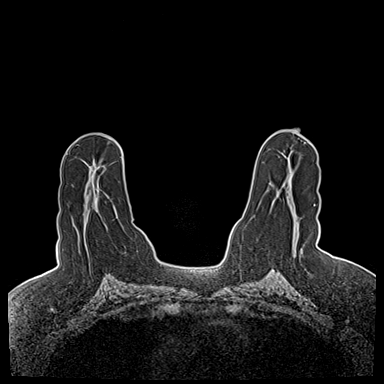
[im 115/144]
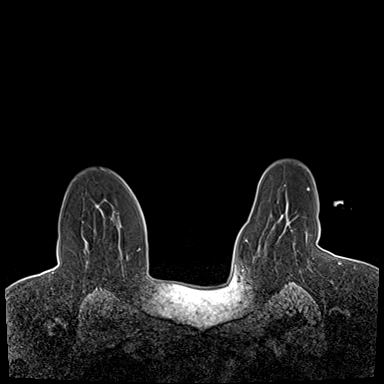
[im 144/144]
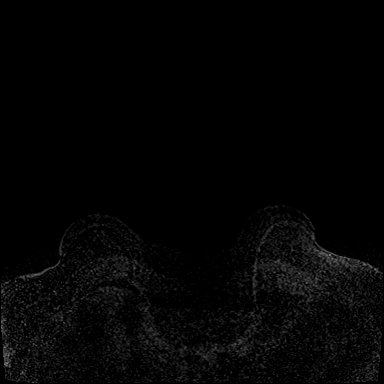

[Series 4: dynamic post 20 · axial · 1.3mm · 0.73mm/px · z∈[-65,+121]mm · 6 of 144 slices shown (1 of 2)]
[im 1/144]
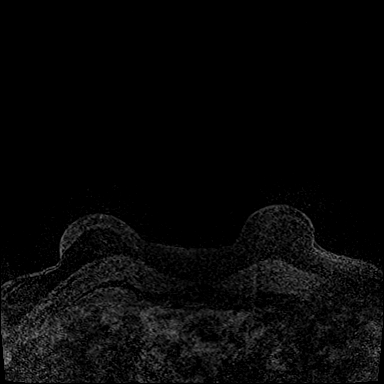
[im 29/144]
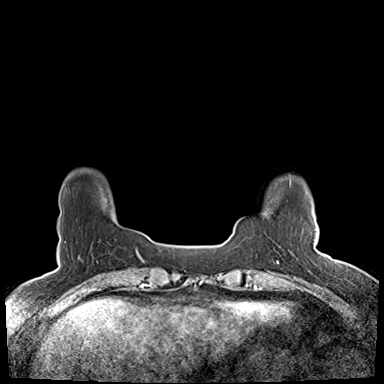
[im 58/144]
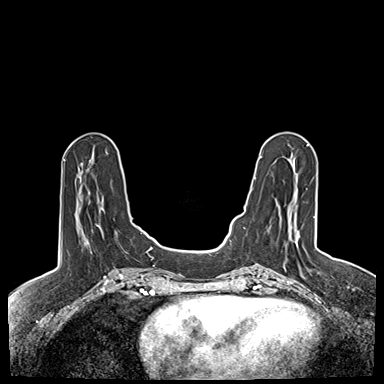
[im 86/144]
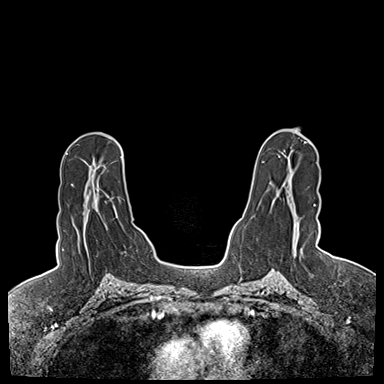
[im 115/144]
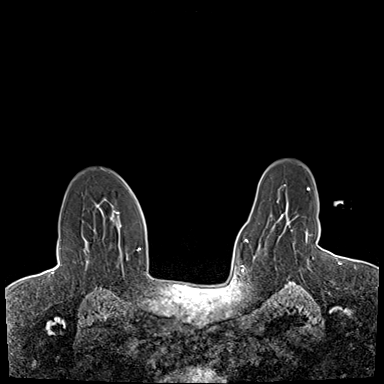
[im 144/144]
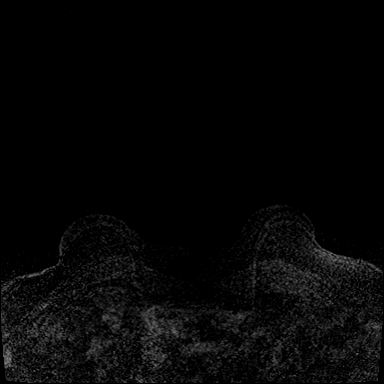

[Series 5: dynamic post 20 · axial · 1.3mm · 0.73mm/px · z∈[-65,+121]mm · 6 of 144 slices shown (2 of 2)]
[im 1/144]
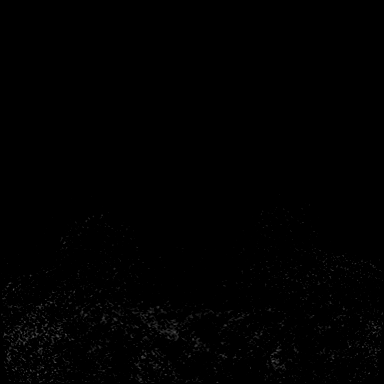
[im 29/144]
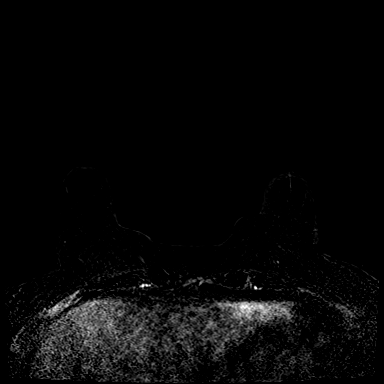
[im 58/144]
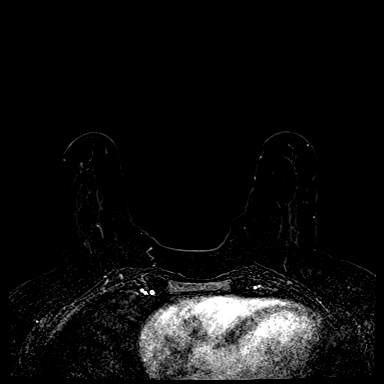
[im 86/144]
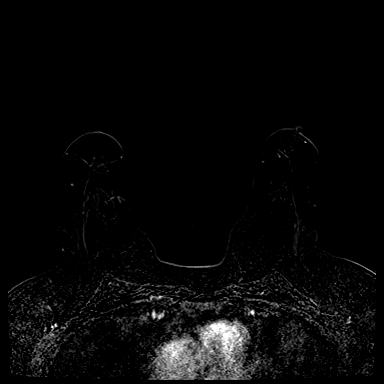
[im 115/144]
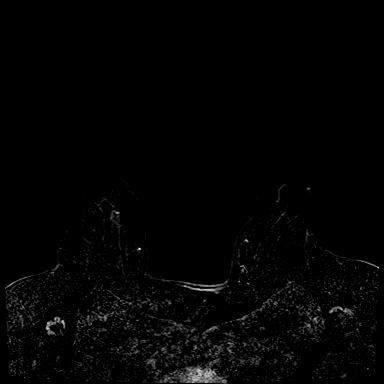
[im 144/144]
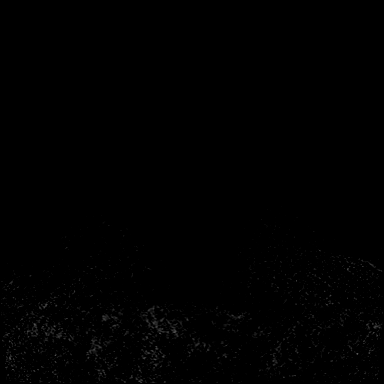

[24 of 48 positions shown; findings below may reference images not displayed]

FINDINGS: I met with the patient, and we discussed the procedure of MRI guided
biopsy, including risks, benefits, and alternatives. Specifically,
we discussed the risks of infection, bleeding, tissue injury, clip
migration, and inadequate sampling. Informed, written consent was
given. The usual time out protocol was performed immediately prior
to the procedure.

Scout imaging is performed, demonstrating the following:

RIGHT breast:

Persistent enhancement of a circumscribed 4 millimeter mass in the
anterior third of the LOWER OUTER QUADRANT of the RIGHT breast is
seen on image 87 of series 5.

Rim enhancing mass in the posterior LOWER OUTER QUADRANT of the
RIGHT breast is confirmed on image 80 of series 5. (Biopsied)

LEFT breast:

Persistent enhancement of 1.3 centimeter mass in the posterior LOWER
OUTER QUADRANT of the LEFT breast on image 71 of series 5.
(Biopsied)

Persistent enhancement of 4 millimeter enhancing mass in the
posterior LOWER OUTER QUADRANT of the LEFT breast, on image 83 of
series 5.

Persistent enhancing 4 millimeter mass in the anterior LOWER INNER
QUADRANT of the LEFT breast on image 81 of series 5.

Site 1: LEFT breast, barbell shaped clip. Lesion QUADRANT: Posterior
LOWER OUTER QUADRANT LEFT breast

Using sterile technique, 1% Lidocaine, MRI guidance, and a 9 gauge
vacuum assisted device, biopsy was performed of enhancing mass in
the posterior LOWER OUTER QUADRANT of the LEFT breast using a
LATERAL approach. At the conclusion of the procedure, a barbell
shaped tissue marker clip was deployed into the biopsy cavity.

Site 2: RIGHT breast, a barbell shaped clip. Lesion QUADRANT:
Posterior LOWER OUTER QUADRANT RIGHT breast

Using sterile technique, 1% Lidocaine, MRI guidance, and a 9 gauge
vacuum assisted device, biopsy was performed of enhancing mass in
the posterior LOWER OUTER QUADRANT of the RIGHT breast using a
LATERAL approach. At the conclusion of the procedure, a barbell
shaped tissue marker clip was deployed into the biopsy cavity.

Follow-up 2-view mammogram was performed and dictated separately.
IMPRESSION: 1. MRI guided biopsy of bilateral breast masses. No apparent
complications. Biopsy results are pending.
2. If biopsy sites are benign and concordant, recommend follow-up
bilateral breast MRI in 6 months to assess stability of the
additional lesions bilaterally.
3. If either of the biopsies shows malignancy or atypia, consider MR
guided core biopsies of the additional lesions, described above (1
additional lesion in the RIGHT breast; 2 additional lesions in the
LEFT breast).

ADDENDUM:
Pathology revealed FIBROADENOMA. FIBROCYSTIC CHANGES WITH USUAL
DUCTAL HYPERPLASIA of the LEFT breast, lower outer quadrant
posterior. This was found to be concordant by Dr. TIGER.

Pathology revealed FIBROCYSTIC CHANGES of the RIGHT breast, lower
outer quadrant posterior. This was found to be concordant by Dr.
TIGER.

Pathology results were discussed with the patient by telephone. The
patient reported doing well after the biopsies with tenderness at
the sites. Post biopsy instructions and care were reviewed and
questions were answered. The patient was encouraged to call The

The patient was instructed to return for annual diagnostic
mammography and informed a reminder notice would be sent regarding
this appointment. Bilateral breast MRI recommended in 6 months per
protocol.

Pathology results reported by TIGER, RN on [DATE].

ADDENDUM:
The patient was instructed to return for annual screening
MRI recommended in 6 months per protocol.

TIGER, RN on [DATE].

*** End of Addendum ***
Addendum:
FINDINGS: I met with the patient, and we discussed the procedure of MRI guided
biopsy, including risks, benefits, and alternatives. Specifically,
we discussed the risks of infection, bleeding, tissue injury, clip
migration, and inadequate sampling. Informed, written consent was
given. The usual time out protocol was performed immediately prior
to the procedure.

Scout imaging is performed, demonstrating the following:

RIGHT breast:

Persistent enhancement of a circumscribed 4 millimeter mass in the
anterior third of the LOWER OUTER QUADRANT of the RIGHT breast is
seen on image 87 of series 5.

Rim enhancing mass in the posterior LOWER OUTER QUADRANT of the
RIGHT breast is confirmed on image 80 of series 5. (Biopsied)

LEFT breast:

Persistent enhancement of 1.3 centimeter mass in the posterior LOWER
OUTER QUADRANT of the LEFT breast on image 71 of series 5.
(Biopsied)

Persistent enhancement of 4 millimeter enhancing mass in the
posterior LOWER OUTER QUADRANT of the LEFT breast, on image 83 of
series 5.

Persistent enhancing 4 millimeter mass in the anterior LOWER INNER
QUADRANT of the LEFT breast on image 81 of series 5.

Site 1: LEFT breast, barbell shaped clip. Lesion QUADRANT: Posterior
LOWER OUTER QUADRANT LEFT breast

Using sterile technique, 1% Lidocaine, MRI guidance, and a 9 gauge
vacuum assisted device, biopsy was performed of enhancing mass in
the posterior LOWER OUTER QUADRANT of the LEFT breast using a
LATERAL approach. At the conclusion of the procedure, a barbell
shaped tissue marker clip was deployed into the biopsy cavity.

Site 2: RIGHT breast, a barbell shaped clip. Lesion QUADRANT:
Posterior LOWER OUTER QUADRANT RIGHT breast

Using sterile technique, 1% Lidocaine, MRI guidance, and a 9 gauge
vacuum assisted device, biopsy was performed of enhancing mass in
the posterior LOWER OUTER QUADRANT of the RIGHT breast using a
LATERAL approach. At the conclusion of the procedure, a barbell
shaped tissue marker clip was deployed into the biopsy cavity.

Follow-up 2-view mammogram was performed and dictated separately.
IMPRESSION: 1. MRI guided biopsy of bilateral breast masses. No apparent
complications. Biopsy results are pending.
2. If biopsy sites are benign and concordant, recommend follow-up
bilateral breast MRI in 6 months to assess stability of the
additional lesions bilaterally.
3. If either of the biopsies shows malignancy or atypia, consider MR
guided core biopsies of the additional lesions, described above (1
additional lesion in the RIGHT breast; 2 additional lesions in the
LEFT breast).

ADDENDUM:
Pathology revealed FIBROADENOMA. FIBROCYSTIC CHANGES WITH USUAL
DUCTAL HYPERPLASIA of the LEFT breast, lower outer quadrant
posterior. This was found to be concordant by Dr. TIGER.

Pathology revealed FIBROCYSTIC CHANGES of the RIGHT breast, lower
outer quadrant posterior. This was found to be concordant by Dr.
TIGER.

Pathology results were discussed with the patient by telephone. The
patient reported doing well after the biopsies with tenderness at
the sites. Post biopsy instructions and care were reviewed and
questions were answered. The patient was encouraged to call The

The patient was instructed to return for annual diagnostic
mammography and informed a reminder notice would be sent regarding
this appointment. Bilateral breast MRI recommended in 6 months per
protocol.

Pathology results reported by TIGER, RN on [DATE].

*** End of Addendum ***
FINDINGS: I met with the patient, and we discussed the procedure of MRI guided
biopsy, including risks, benefits, and alternatives. Specifically,
we discussed the risks of infection, bleeding, tissue injury, clip
migration, and inadequate sampling. Informed, written consent was
given. The usual time out protocol was performed immediately prior
to the procedure.

Scout imaging is performed, demonstrating the following:

RIGHT breast:

Persistent enhancement of a circumscribed 4 millimeter mass in the
anterior third of the LOWER OUTER QUADRANT of the RIGHT breast is
seen on image 87 of series 5.

Rim enhancing mass in the posterior LOWER OUTER QUADRANT of the
RIGHT breast is confirmed on image 80 of series 5. (Biopsied)

LEFT breast:

Persistent enhancement of 1.3 centimeter mass in the posterior LOWER
OUTER QUADRANT of the LEFT breast on image 71 of series 5.
(Biopsied)

Persistent enhancement of 4 millimeter enhancing mass in the
posterior LOWER OUTER QUADRANT of the LEFT breast, on image 83 of
series 5.

Persistent enhancing 4 millimeter mass in the anterior LOWER INNER
QUADRANT of the LEFT breast on image 81 of series 5.

Site 1: LEFT breast, barbell shaped clip. Lesion QUADRANT: Posterior
LOWER OUTER QUADRANT LEFT breast

Using sterile technique, 1% Lidocaine, MRI guidance, and a 9 gauge
vacuum assisted device, biopsy was performed of enhancing mass in
the posterior LOWER OUTER QUADRANT of the LEFT breast using a
LATERAL approach. At the conclusion of the procedure, a barbell
shaped tissue marker clip was deployed into the biopsy cavity.

Site 2: RIGHT breast, a barbell shaped clip. Lesion QUADRANT:
Posterior LOWER OUTER QUADRANT RIGHT breast

Using sterile technique, 1% Lidocaine, MRI guidance, and a 9 gauge
vacuum assisted device, biopsy was performed of enhancing mass in
the posterior LOWER OUTER QUADRANT of the RIGHT breast using a
LATERAL approach. At the conclusion of the procedure, a barbell
shaped tissue marker clip was deployed into the biopsy cavity.

Follow-up 2-view mammogram was performed and dictated separately.
IMPRESSION: 1. MRI guided biopsy of bilateral breast masses. No apparent
complications. Biopsy results are pending.
2. If biopsy sites are benign and concordant, recommend follow-up
bilateral breast MRI in 6 months to assess stability of the
additional lesions bilaterally.
3. If either of the biopsies shows malignancy or atypia, consider MR
guided core biopsies of the additional lesions, described above (1
additional lesion in the RIGHT breast; 2 additional lesions in the
LEFT breast).

## 2019-01-27 IMAGING — MR MR BREAST BX W LOC DEV 1ST LESION IMAGE BX SPEC MR GUIDE*L*
5 of 10 series · 23 of 48 positions shown · IV contrast (7 ml Gadavist)
Comparison: [DATE]
COMPARISON: [DATE]
COMPARISON: [DATE]

Addendum:
CLINICAL DATA: Patient presents for MR guided core biopsy of a mass
in each breast. Recent MRI was performed for screening secondary to
[[place]] gene. The patient also has a sister with recent diagnosis of
breast cancer at age 47, also positive for the [[place]] gene

EXAM:
MRI GUIDED CORE NEEDLE BIOPSY OF THE BILATERAL BREASTS
TECHNIQUE: Multiplanar, multisequence MR imaging of the bilateral breasts was
performed both before and after administration of intravenous
contrast.
CONTRAST:  7mL GADAVIST GADOBUTROL 1 MMOL/ML IV SOLN

[Series 2: fiducial bilateral · sagittal · 2.0mm · 1.33mm/px · 4 of 144 slices shown]
[im 1/144]
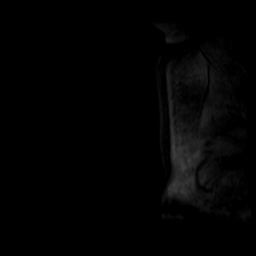
[im 48/144]
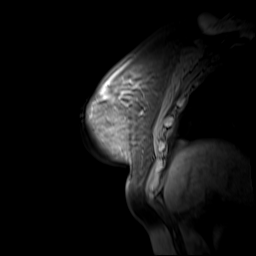
[im 96/144]
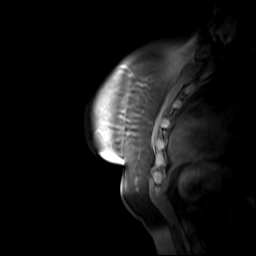
[im 144/144]
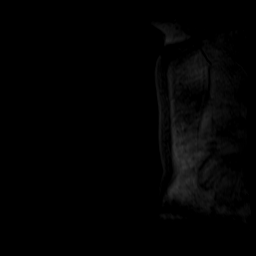

[Series 3: dynamic pre · axial · non-contrast · 1.3mm · 0.73mm/px · z∈[-65,+121]mm · 4 of 144 slices shown]
[im 1/144]
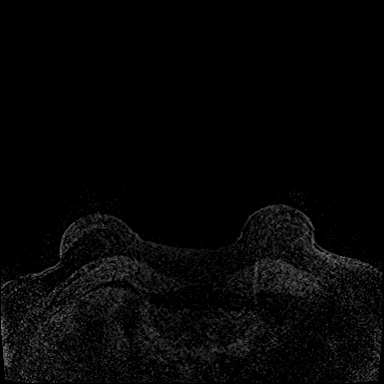
[im 48/144]
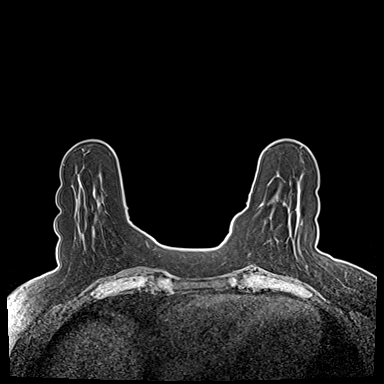
[im 96/144]
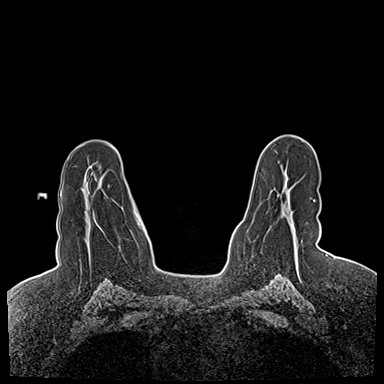
[im 144/144]
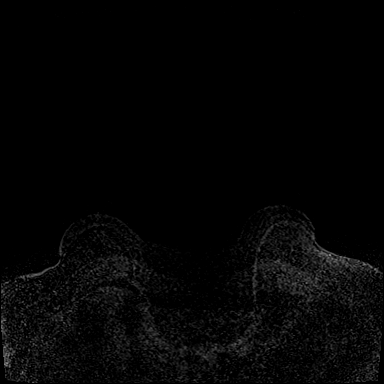

[Series 4: dynamic post 20 · axial · 1.3mm · 0.73mm/px · z∈[-65,+121]mm · 5 of 144 slices shown (1 of 2)]
[im 1/144]
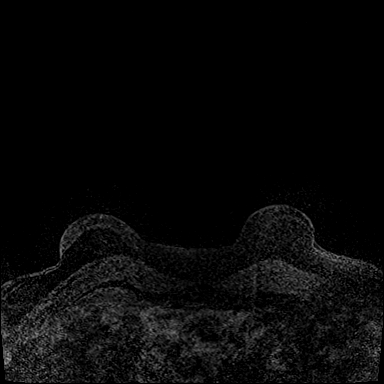
[im 36/144]
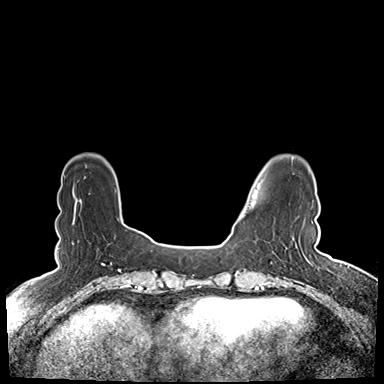
[im 72/144]
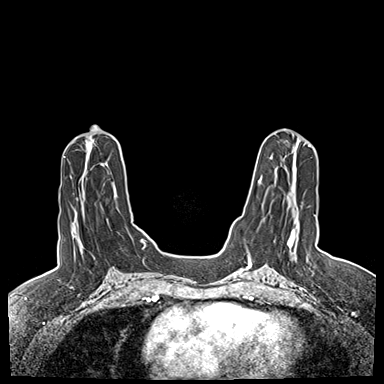
[im 108/144]
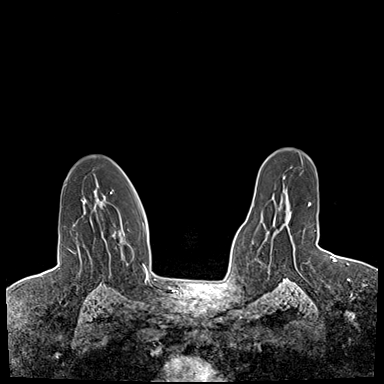
[im 144/144]
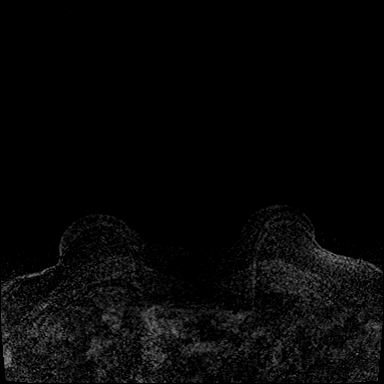

[Series 5: dynamic post 20 · axial · 1.3mm · 0.73mm/px · z∈[-65,+121]mm · 5 of 144 slices shown (2 of 2)]
[im 1/144]
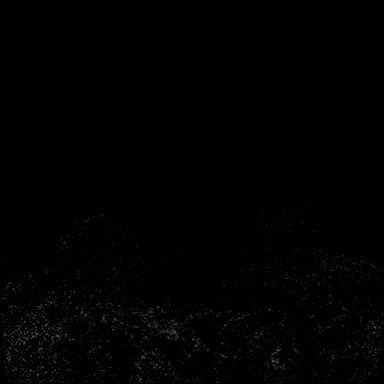
[im 36/144]
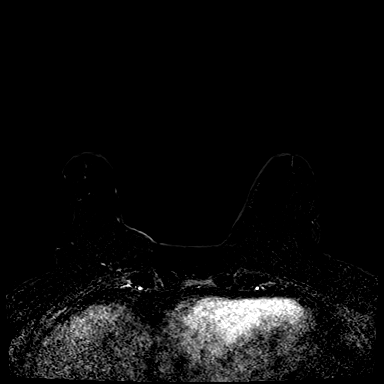
[im 72/144]
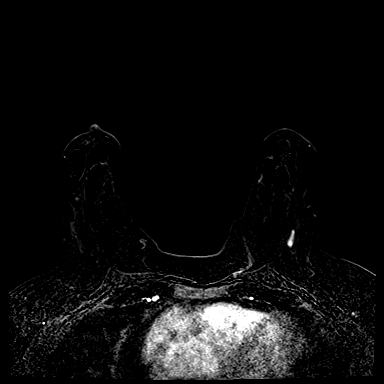
[im 108/144]
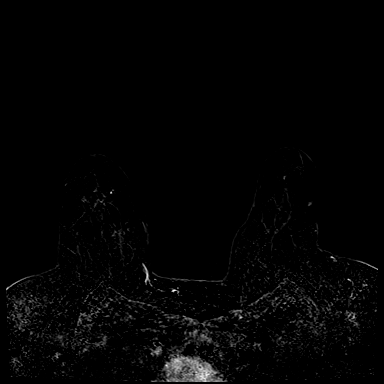
[im 144/144]
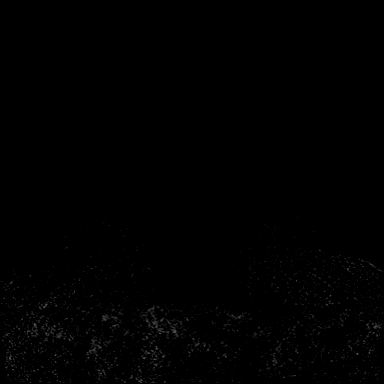

[Series 6: dynamic post 3 · axial · 1.3mm · 0.73mm/px · z∈[-65,+121]mm · 5 of 144 slices shown]
[im 1/144]
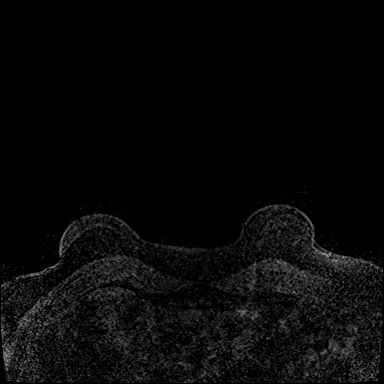
[im 36/144]
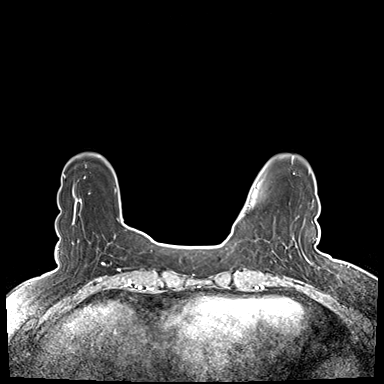
[im 72/144]
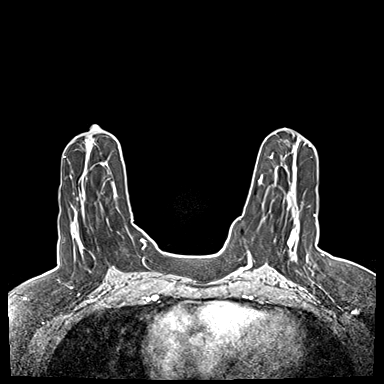
[im 108/144]
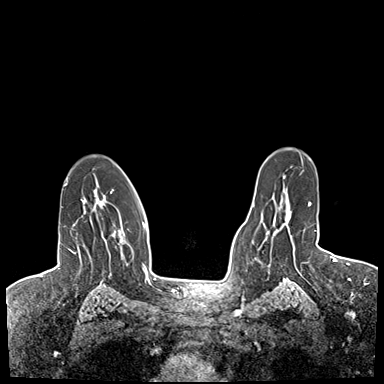
[im 144/144]
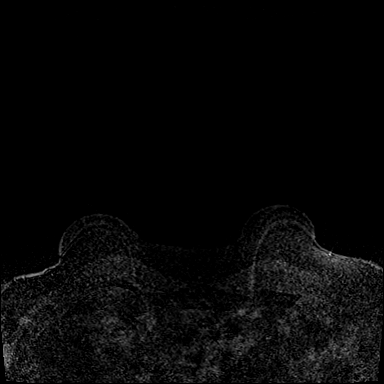

[23 of 48 positions shown; findings below may reference images not displayed]

FINDINGS: I met with the patient, and we discussed the procedure of MRI guided
biopsy, including risks, benefits, and alternatives. Specifically,
we discussed the risks of infection, bleeding, tissue injury, clip
migration, and inadequate sampling. Informed, written consent was
given. The usual time out protocol was performed immediately prior
to the procedure.

Scout imaging is performed, demonstrating the following:

RIGHT breast:

Persistent enhancement of a circumscribed 4 millimeter mass in the
anterior third of the LOWER OUTER QUADRANT of the RIGHT breast is
seen on image 87 of series 5.

Rim enhancing mass in the posterior LOWER OUTER QUADRANT of the
RIGHT breast is confirmed on image 80 of series 5. (Biopsied)

LEFT breast:

Persistent enhancement of 1.3 centimeter mass in the posterior LOWER
OUTER QUADRANT of the LEFT breast on image 71 of series 5.
(Biopsied)

Persistent enhancement of 4 millimeter enhancing mass in the
posterior LOWER OUTER QUADRANT of the LEFT breast, on image 83 of
series 5.

Persistent enhancing 4 millimeter mass in the anterior LOWER INNER
QUADRANT of the LEFT breast on image 81 of series 5.

Site 1: LEFT breast, barbell shaped clip. Lesion QUADRANT: Posterior
LOWER OUTER QUADRANT LEFT breast

Using sterile technique, 1% Lidocaine, MRI guidance, and a 9 gauge
vacuum assisted device, biopsy was performed of enhancing mass in
the posterior LOWER OUTER QUADRANT of the LEFT breast using a
LATERAL approach. At the conclusion of the procedure, a barbell
shaped tissue marker clip was deployed into the biopsy cavity.

Site 2: RIGHT breast, a barbell shaped clip. Lesion QUADRANT:
Posterior LOWER OUTER QUADRANT RIGHT breast

Using sterile technique, 1% Lidocaine, MRI guidance, and a 9 gauge
vacuum assisted device, biopsy was performed of enhancing mass in
the posterior LOWER OUTER QUADRANT of the RIGHT breast using a
LATERAL approach. At the conclusion of the procedure, a barbell
shaped tissue marker clip was deployed into the biopsy cavity.

Follow-up 2-view mammogram was performed and dictated separately.
IMPRESSION: 1. MRI guided biopsy of bilateral breast masses. No apparent
complications. Biopsy results are pending.
2. If biopsy sites are benign and concordant, recommend follow-up
bilateral breast MRI in 6 months to assess stability of the
additional lesions bilaterally.
3. If either of the biopsies shows malignancy or atypia, consider MR
guided core biopsies of the additional lesions, described above (1
additional lesion in the RIGHT breast; 2 additional lesions in the
LEFT breast).

ADDENDUM:
Pathology revealed FIBROADENOMA. FIBROCYSTIC CHANGES WITH USUAL
DUCTAL HYPERPLASIA of the LEFT breast, lower outer quadrant
posterior. This was found to be concordant by Dr. TIGER.

Pathology revealed FIBROCYSTIC CHANGES of the RIGHT breast, lower
outer quadrant posterior. This was found to be concordant by Dr.
TIGER.

Pathology results were discussed with the patient by telephone. The
patient reported doing well after the biopsies with tenderness at
the sites. Post biopsy instructions and care were reviewed and
questions were answered. The patient was encouraged to call The

The patient was instructed to return for annual diagnostic
mammography and informed a reminder notice would be sent regarding
this appointment. Bilateral breast MRI recommended in 6 months per
protocol.

Pathology results reported by TIGER, RN on [DATE].

ADDENDUM:
The patient was instructed to return for annual screening
MRI recommended in 6 months per protocol.

TIGER, RN on [DATE].

*** End of Addendum ***
Addendum:
FINDINGS: I met with the patient, and we discussed the procedure of MRI guided
biopsy, including risks, benefits, and alternatives. Specifically,
we discussed the risks of infection, bleeding, tissue injury, clip
migration, and inadequate sampling. Informed, written consent was
given. The usual time out protocol was performed immediately prior
to the procedure.

Scout imaging is performed, demonstrating the following:

RIGHT breast:

Persistent enhancement of a circumscribed 4 millimeter mass in the
anterior third of the LOWER OUTER QUADRANT of the RIGHT breast is
seen on image 87 of series 5.

Rim enhancing mass in the posterior LOWER OUTER QUADRANT of the
RIGHT breast is confirmed on image 80 of series 5. (Biopsied)

LEFT breast:

Persistent enhancement of 1.3 centimeter mass in the posterior LOWER
OUTER QUADRANT of the LEFT breast on image 71 of series 5.
(Biopsied)

Persistent enhancement of 4 millimeter enhancing mass in the
posterior LOWER OUTER QUADRANT of the LEFT breast, on image 83 of
series 5.

Persistent enhancing 4 millimeter mass in the anterior LOWER INNER
QUADRANT of the LEFT breast on image 81 of series 5.

Site 1: LEFT breast, barbell shaped clip. Lesion QUADRANT: Posterior
LOWER OUTER QUADRANT LEFT breast

Using sterile technique, 1% Lidocaine, MRI guidance, and a 9 gauge
vacuum assisted device, biopsy was performed of enhancing mass in
the posterior LOWER OUTER QUADRANT of the LEFT breast using a
LATERAL approach. At the conclusion of the procedure, a barbell
shaped tissue marker clip was deployed into the biopsy cavity.

Site 2: RIGHT breast, a barbell shaped clip. Lesion QUADRANT:
Posterior LOWER OUTER QUADRANT RIGHT breast

Using sterile technique, 1% Lidocaine, MRI guidance, and a 9 gauge
vacuum assisted device, biopsy was performed of enhancing mass in
the posterior LOWER OUTER QUADRANT of the RIGHT breast using a
LATERAL approach. At the conclusion of the procedure, a barbell
shaped tissue marker clip was deployed into the biopsy cavity.

Follow-up 2-view mammogram was performed and dictated separately.
IMPRESSION: 1. MRI guided biopsy of bilateral breast masses. No apparent
complications. Biopsy results are pending.
2. If biopsy sites are benign and concordant, recommend follow-up
bilateral breast MRI in 6 months to assess stability of the
additional lesions bilaterally.
3. If either of the biopsies shows malignancy or atypia, consider MR
guided core biopsies of the additional lesions, described above (1
additional lesion in the RIGHT breast; 2 additional lesions in the
LEFT breast).

ADDENDUM:
Pathology revealed FIBROADENOMA. FIBROCYSTIC CHANGES WITH USUAL
DUCTAL HYPERPLASIA of the LEFT breast, lower outer quadrant
posterior. This was found to be concordant by Dr. TIGER.

Pathology revealed FIBROCYSTIC CHANGES of the RIGHT breast, lower
outer quadrant posterior. This was found to be concordant by Dr.
TIGER.

Pathology results were discussed with the patient by telephone. The
patient reported doing well after the biopsies with tenderness at
the sites. Post biopsy instructions and care were reviewed and
questions were answered. The patient was encouraged to call The

The patient was instructed to return for annual diagnostic
mammography and informed a reminder notice would be sent regarding
this appointment. Bilateral breast MRI recommended in 6 months per
protocol.

Pathology results reported by TIGER, RN on [DATE].

*** End of Addendum ***
FINDINGS: I met with the patient, and we discussed the procedure of MRI guided
biopsy, including risks, benefits, and alternatives. Specifically,
we discussed the risks of infection, bleeding, tissue injury, clip
migration, and inadequate sampling. Informed, written consent was
given. The usual time out protocol was performed immediately prior
to the procedure.

Scout imaging is performed, demonstrating the following:

RIGHT breast:

Persistent enhancement of a circumscribed 4 millimeter mass in the
anterior third of the LOWER OUTER QUADRANT of the RIGHT breast is
seen on image 87 of series 5.

Rim enhancing mass in the posterior LOWER OUTER QUADRANT of the
RIGHT breast is confirmed on image 80 of series 5. (Biopsied)

LEFT breast:

Persistent enhancement of 1.3 centimeter mass in the posterior LOWER
OUTER QUADRANT of the LEFT breast on image 71 of series 5.
(Biopsied)

Persistent enhancement of 4 millimeter enhancing mass in the
posterior LOWER OUTER QUADRANT of the LEFT breast, on image 83 of
series 5.

Persistent enhancing 4 millimeter mass in the anterior LOWER INNER
QUADRANT of the LEFT breast on image 81 of series 5.

Site 1: LEFT breast, barbell shaped clip. Lesion QUADRANT: Posterior
LOWER OUTER QUADRANT LEFT breast

Using sterile technique, 1% Lidocaine, MRI guidance, and a 9 gauge
vacuum assisted device, biopsy was performed of enhancing mass in
the posterior LOWER OUTER QUADRANT of the LEFT breast using a
LATERAL approach. At the conclusion of the procedure, a barbell
shaped tissue marker clip was deployed into the biopsy cavity.

Site 2: RIGHT breast, a barbell shaped clip. Lesion QUADRANT:
Posterior LOWER OUTER QUADRANT RIGHT breast

Using sterile technique, 1% Lidocaine, MRI guidance, and a 9 gauge
vacuum assisted device, biopsy was performed of enhancing mass in
the posterior LOWER OUTER QUADRANT of the RIGHT breast using a
LATERAL approach. At the conclusion of the procedure, a barbell
shaped tissue marker clip was deployed into the biopsy cavity.

Follow-up 2-view mammogram was performed and dictated separately.
IMPRESSION: 1. MRI guided biopsy of bilateral breast masses. No apparent
complications. Biopsy results are pending.
2. If biopsy sites are benign and concordant, recommend follow-up
bilateral breast MRI in 6 months to assess stability of the
additional lesions bilaterally.
3. If either of the biopsies shows malignancy or atypia, consider MR
guided core biopsies of the additional lesions, described above (1
additional lesion in the RIGHT breast; 2 additional lesions in the
LEFT breast).

## 2019-01-27 IMAGING — MG MM BREAST LOCALIZATION CLIP
4 series · 4 of 12 positions shown · non-contrast
Comparison: Previous exam(s).

CLINICAL DATA: Status post MR guided core biopsy of a mass in the
LOWER OUTER QUADRANT of the RIGHT breast.

EXAM:
DIAGNOSTIC RIGHT MAMMOGRAM POST MRI BIOPSY

[R CC synth-2D]
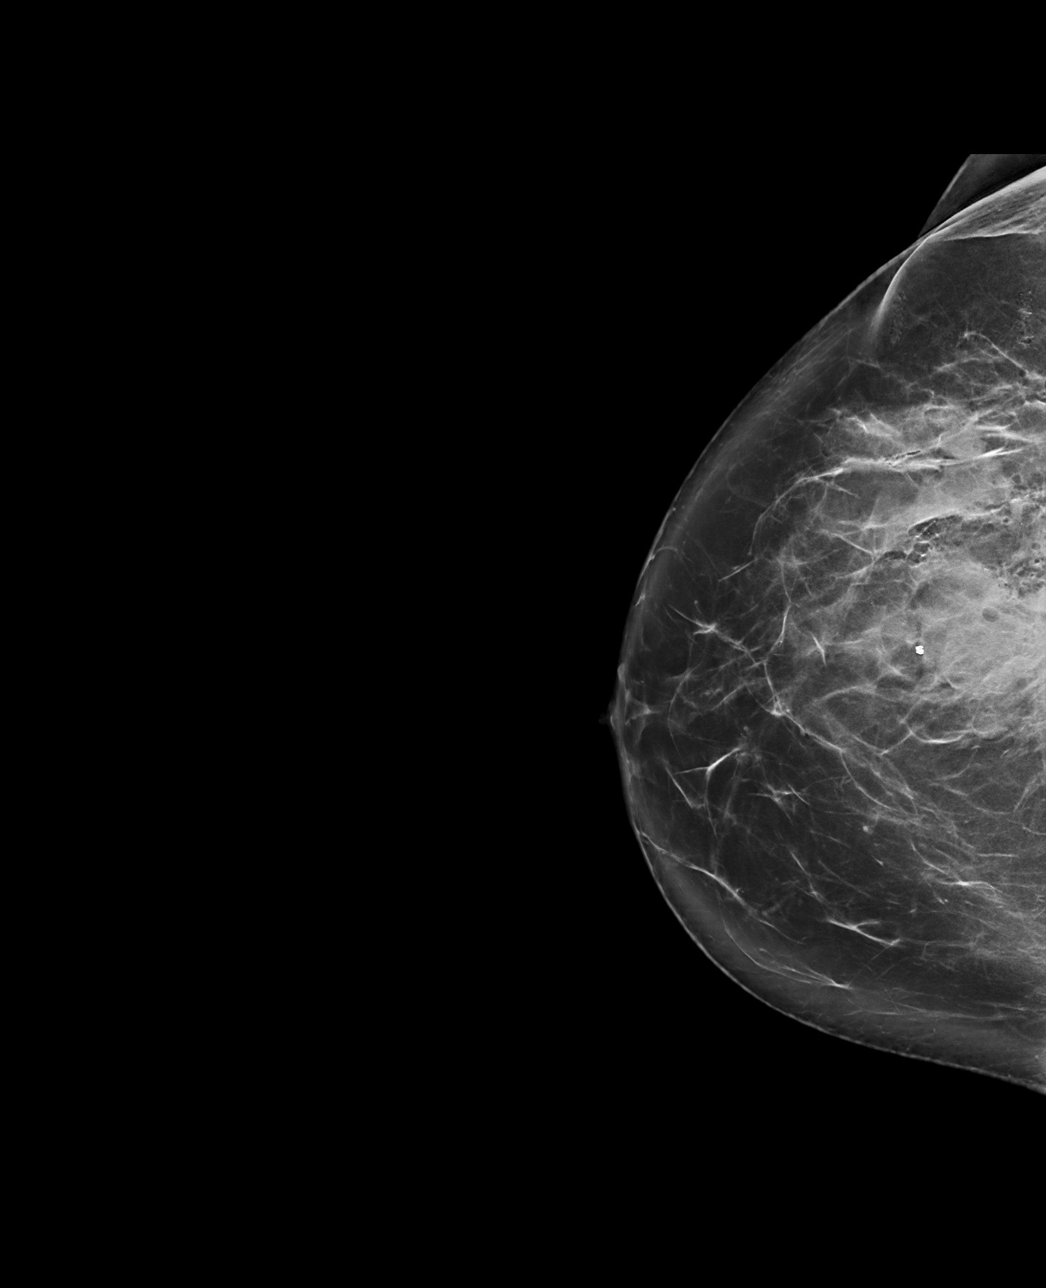

[R ML synth-2D]
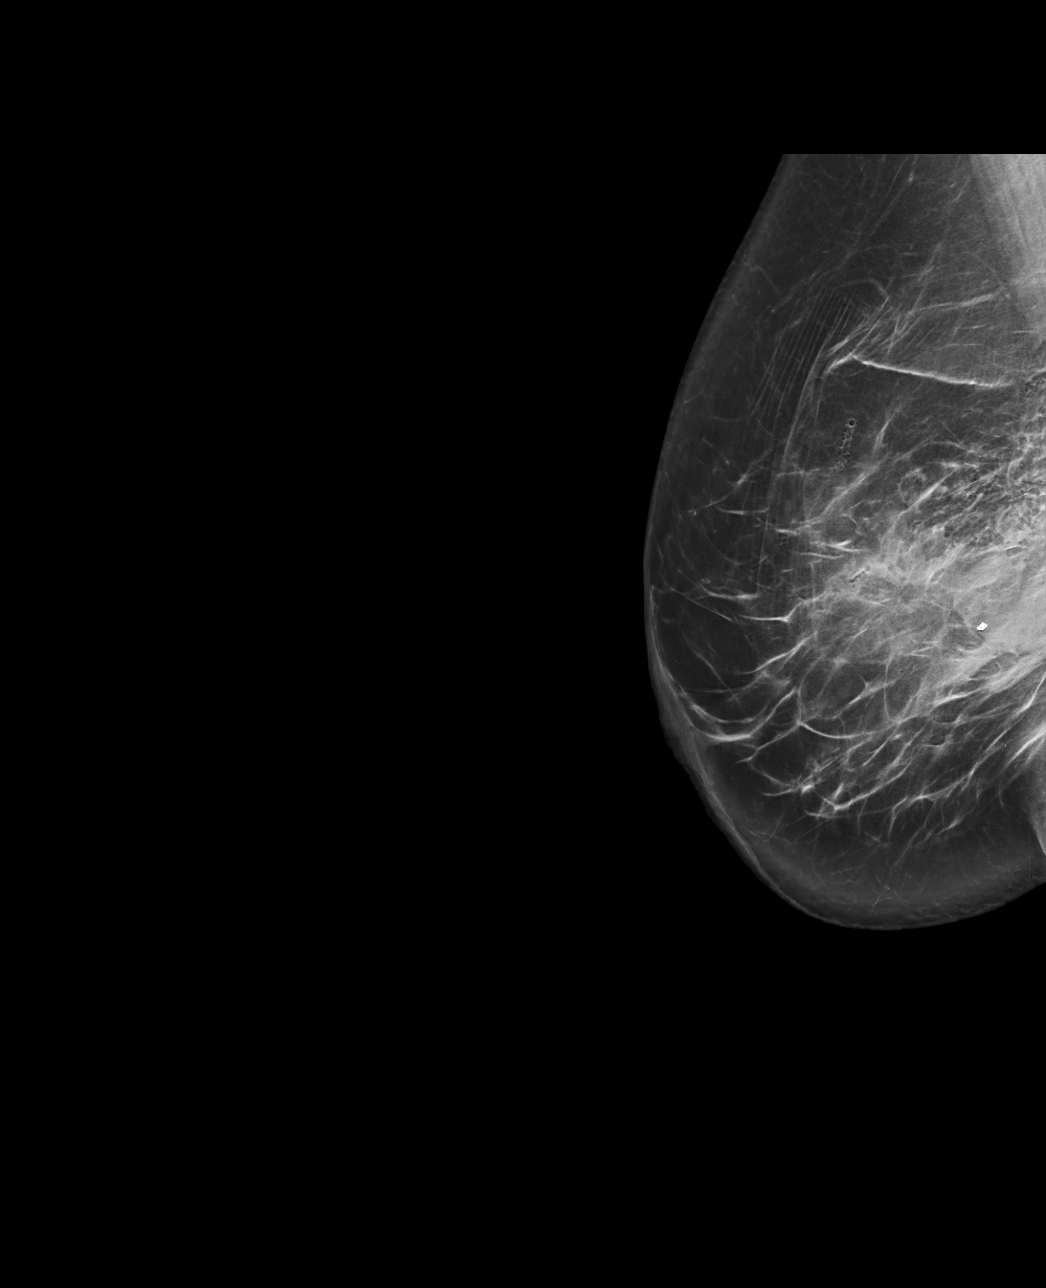

[R CC tomo · tomo slice 43/86.0]
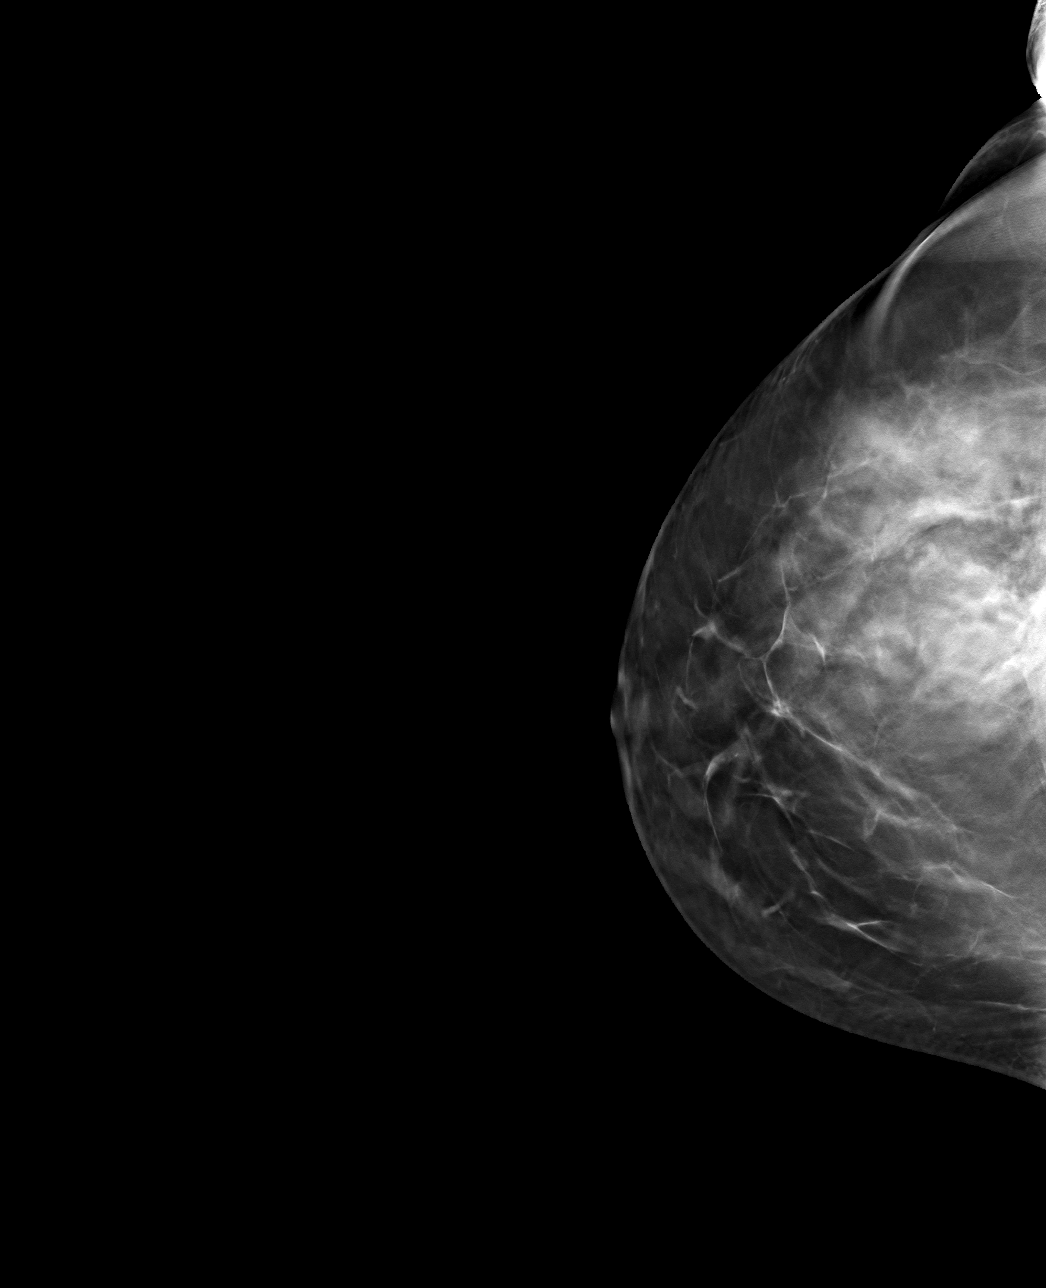

[R ML tomo · tomo slice 47/94.0]
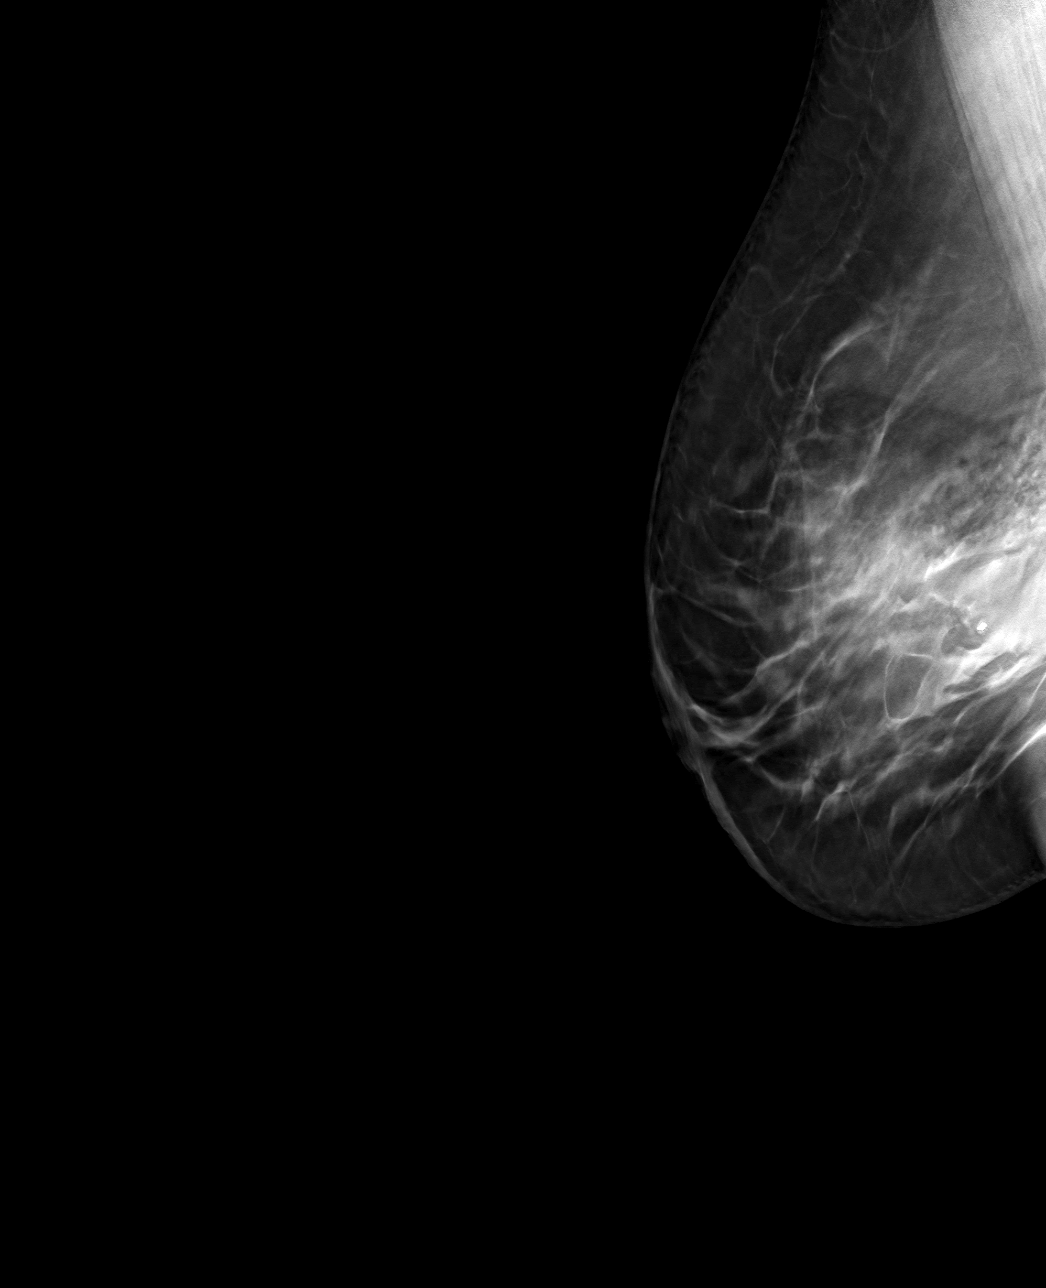

[4 of 12 positions shown; findings below may reference images not displayed]

FINDINGS: Mammographic images were obtained following MRI guided biopsy of
biopsy of mass in the posterior LOWER OUTER QUADRANT of the RIGHT
breast and placement of a barbell shaped clip. The biopsy marking
clip is in expected position at the site of biopsy. Note is made of
moderate hematoma adjacent to the tissue marker clip.

A pressure dressing wrap was applied to both breasts.
IMPRESSION: 1. Appropriate positioning of the barbell shaped biopsy marking clip
at the site of biopsy in the LOWER OUTER QUADRANT of the RIGHT
breast.
2. Hematoma following biopsy.
3. If localization is needed, I would recommend repeat CC and true
LATERAL projections to reassess location of the tissue marker clip
after resolution of the hematoma.

Final Assessment: Post Procedure Mammograms for Marker Placement

## 2019-01-27 IMAGING — MG MM BREAST LOCALIZATION CLIP
4 series · 4 of 12 positions shown · non-contrast
Comparison: Previous exam(s).

CLINICAL DATA: Status post MR guided core biopsy of enhancing mass
in the LOWER OUTER QUADRANT of the LEFT breast.

EXAM:
DIAGNOSTIC LEFT MAMMOGRAM POST MRI BIOPSY

[L ML synth-2D]
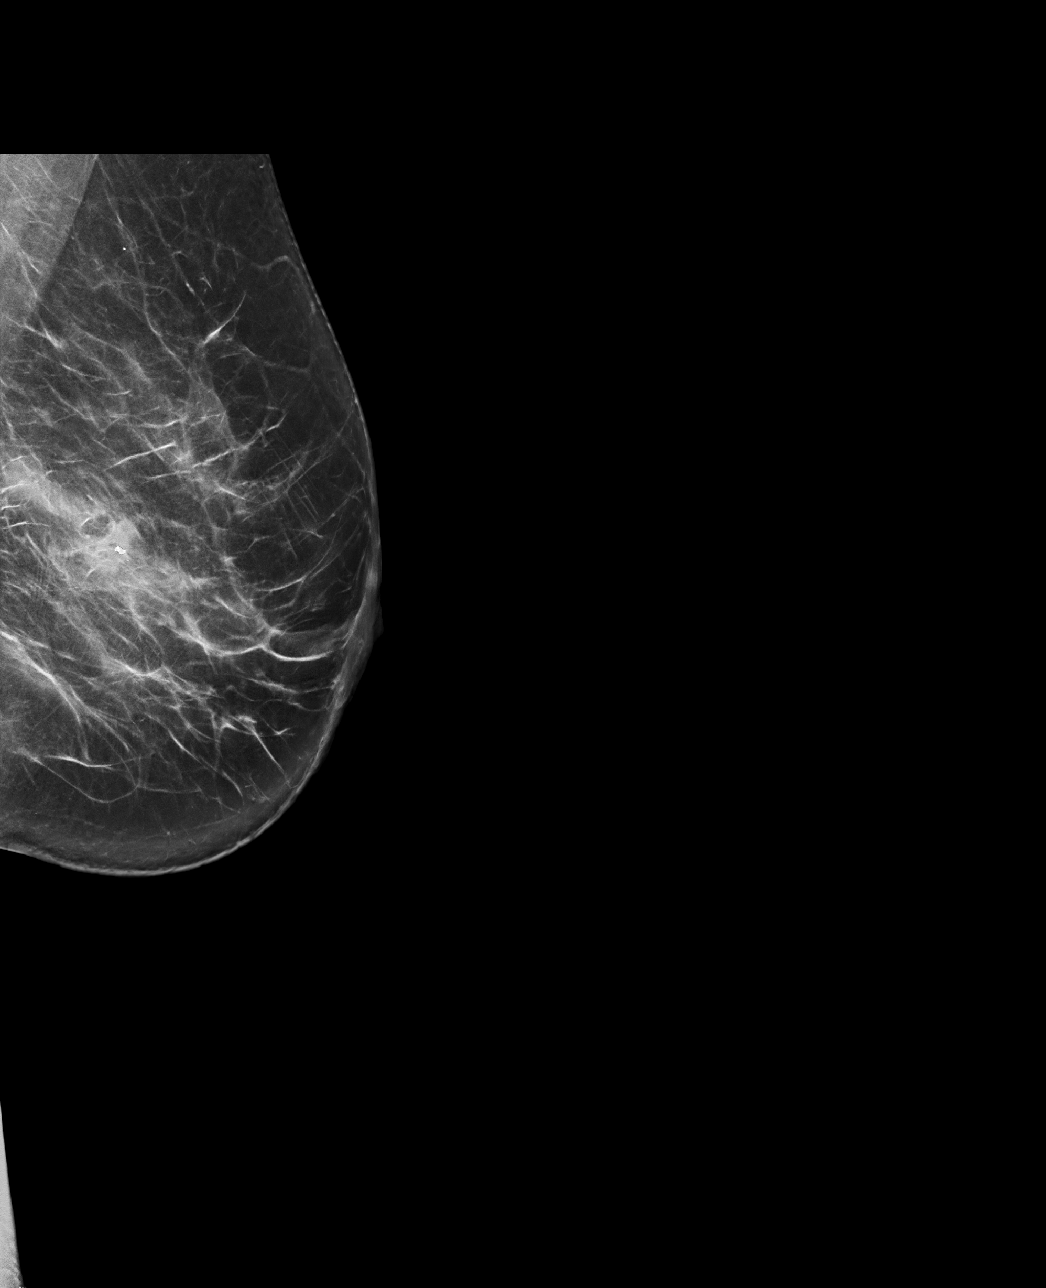

[L CC synth-2D]
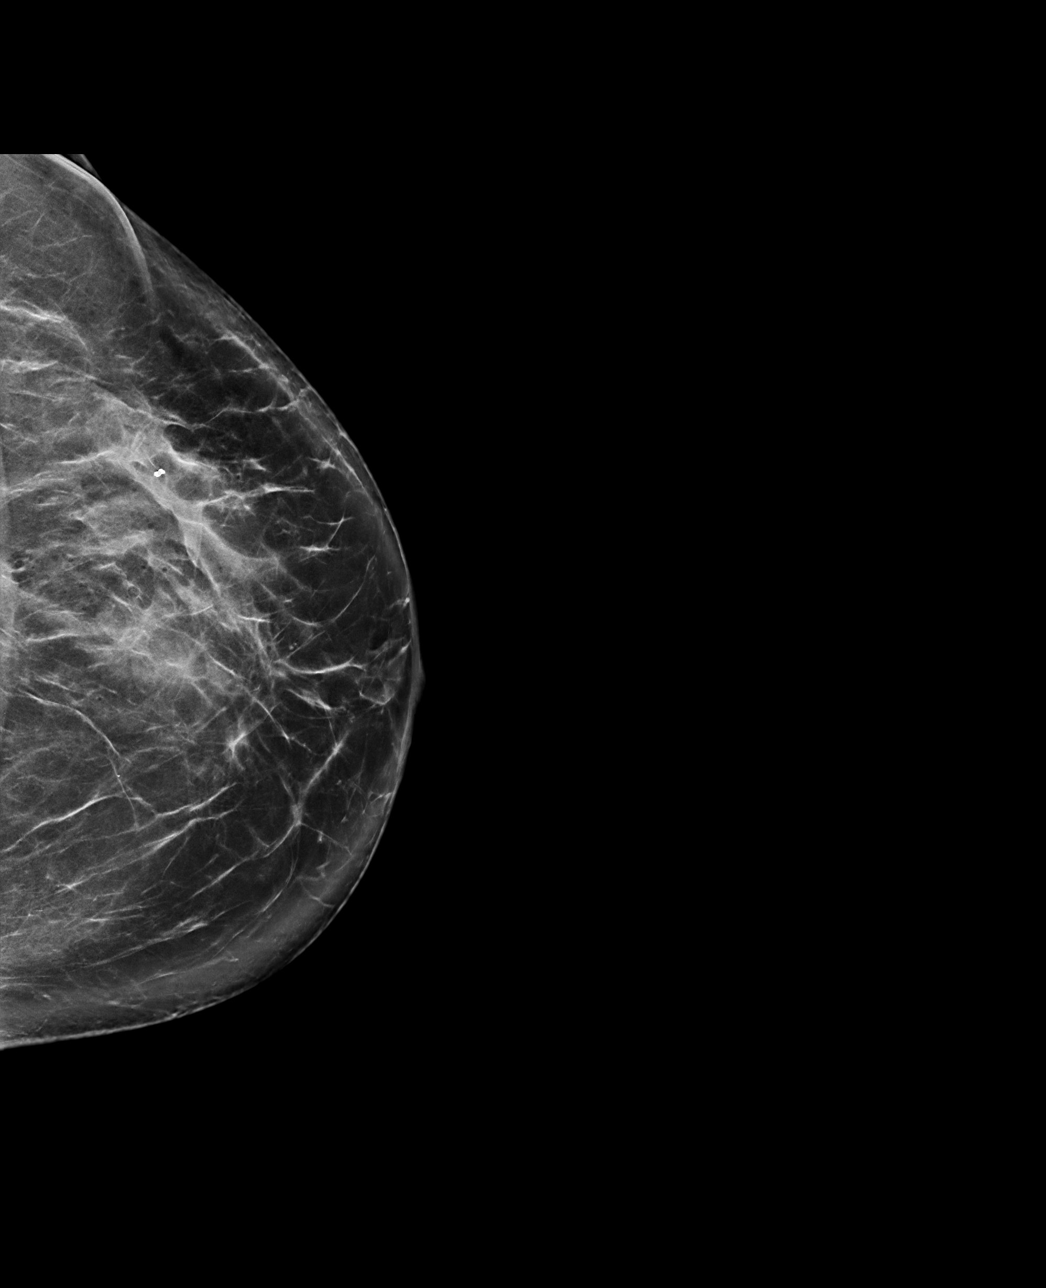

[L ML tomo · tomo slice 43/85.0]
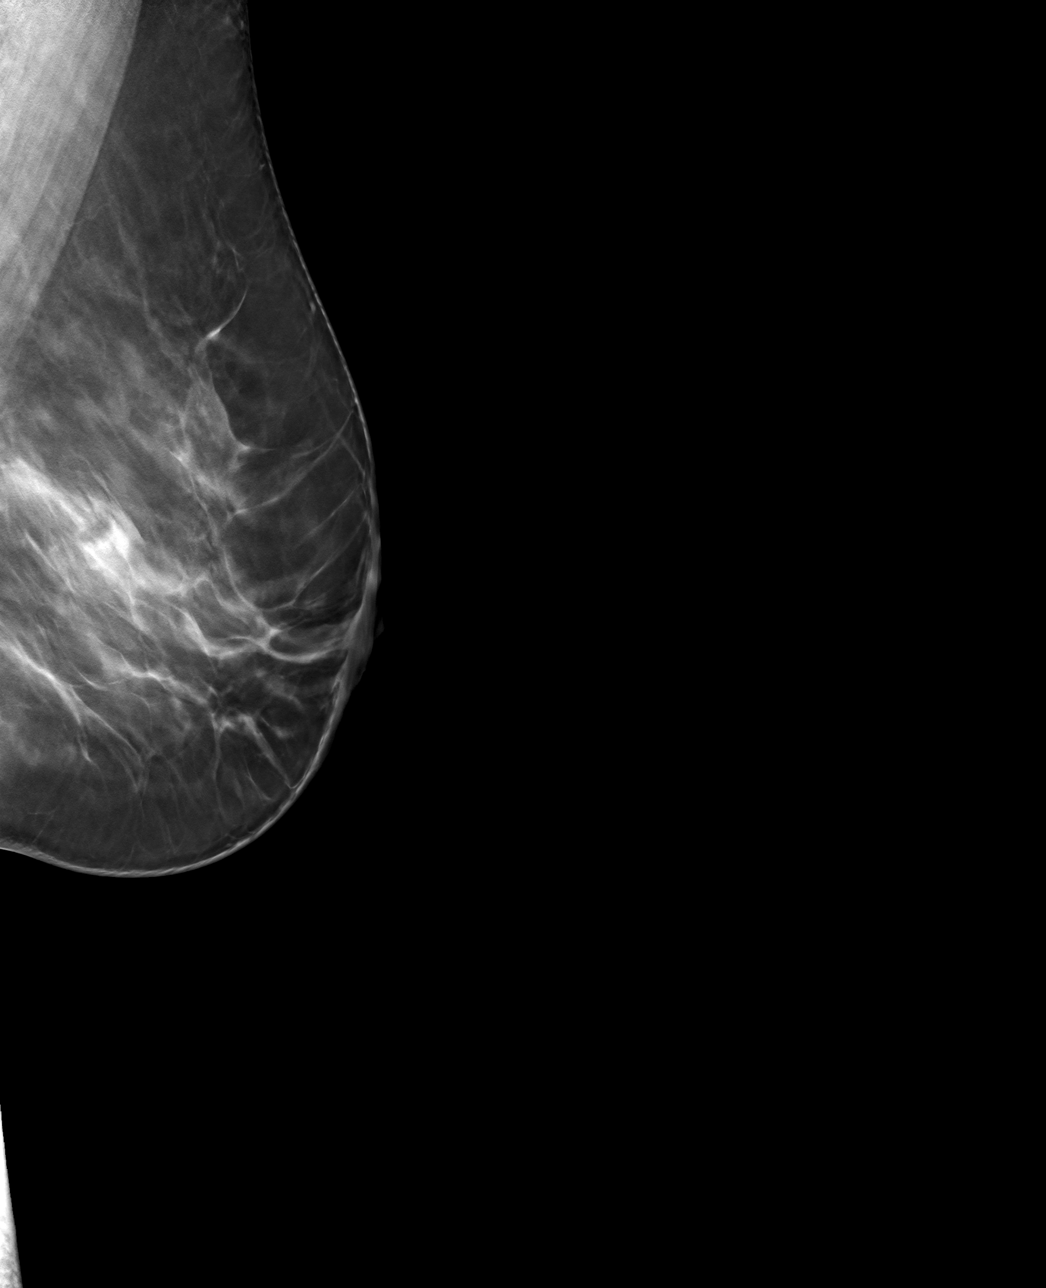

[L CC tomo · tomo slice 41/81.0]
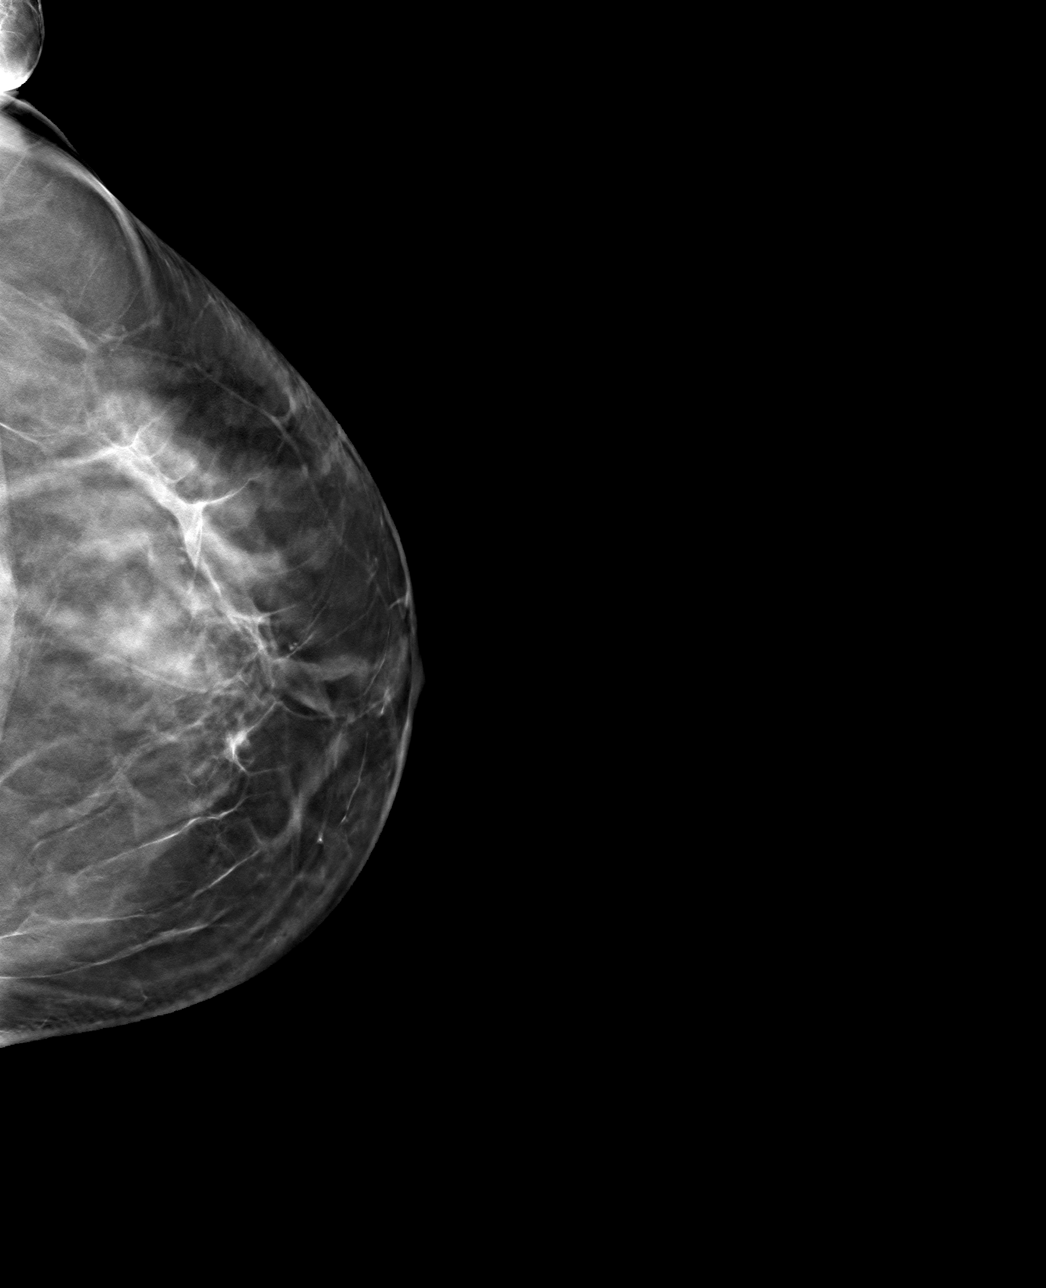

[4 of 12 positions shown; findings below may reference images not displayed]

FINDINGS: Mammographic images were obtained following MRI guided biopsy of
mass in the posterior LOWER OUTER QUADRANT of the LEFT breast and
placement of a barbell shaped clip. The biopsy marking clip is in
expected position at the site of biopsy.
IMPRESSION: Appropriate positioning of the barbell shaped biopsy marking clip at
the site of biopsy in the LOWER OUTER QUADRANT of the LEFT breast.

Final Assessment: Post Procedure Mammograms for Marker Placement

## 2019-01-27 MED ORDER — GADOBUTROL 1 MMOL/ML IV SOLN
7.0000 mL | Freq: Once | INTRAVENOUS | Status: AC | PRN
  Administered 2019-01-27: 7 mL via INTRAVENOUS

## 2019-07-21 ENCOUNTER — Other Ambulatory Visit: Payer: Self-pay | Admitting: Obstetrics and Gynecology

## 2019-07-21 DIAGNOSIS — N63 Unspecified lump in unspecified breast: Secondary | ICD-10-CM

## 2019-08-10 ENCOUNTER — Other Ambulatory Visit: Payer: Self-pay

## 2019-08-10 ENCOUNTER — Ambulatory Visit
Admission: RE | Admit: 2019-08-10 | Discharge: 2019-08-10 | Disposition: A | Discharge status: Home / Self Care | Payer: 59 | Source: Ambulatory Visit | Attending: Obstetrics and Gynecology | Admitting: Obstetrics and Gynecology

## 2019-08-10 DIAGNOSIS — N63 Unspecified lump in unspecified breast: Secondary | ICD-10-CM

## 2019-08-10 IMAGING — MR MR BREAST BILAT WO/W CM
8 of 12 series · 33 of 48 positions shown · IV contrast (9 ml gadavist)
Comparison: Previous exam(s).

CLINICAL DATA: 48-year-old female presenting for six-month
follow-up after bilateral MRI guided biopsies demonstrating benign
pathology in [DATE].

LABS:  None performed on site.
EXAM:
BILATERAL BREAST MRI WITH AND WITHOUT CONTRAST
TECHNIQUE: Multiplanar, multisequence MR images of both breasts were obtained
prior to and following the intravenous administration of 9 ml of
Gadavist.

[Series 2: t2_tirm_tra ipat (a-p) · axial · 3.0mm · 0.70mm/px · 1 of 55 slices shown]
[im 1/55]
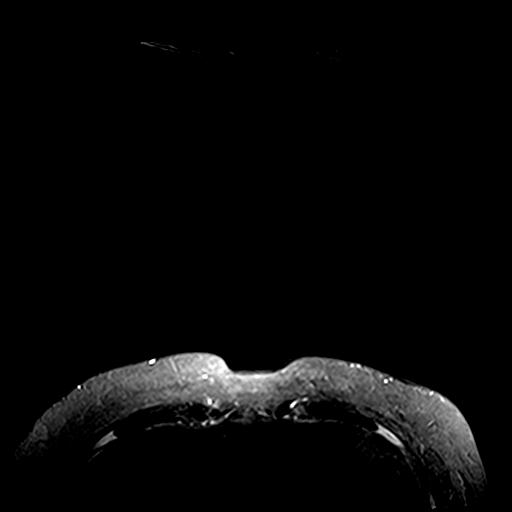

[Series 3: fl3d pre-cm no · axial · non-contrast · 1.2mm · 0.94mm/px · z∈[-79,+92]mm · 5 of 144 slices shown]
[im 1/144]
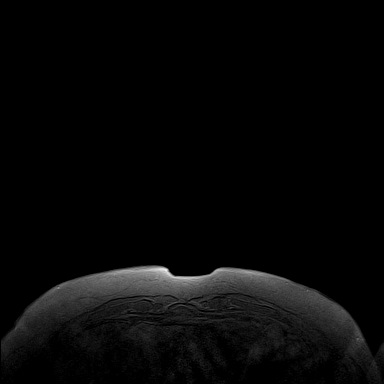
[im 36/144]
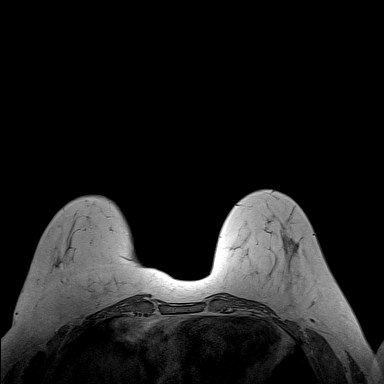
[im 72/144]
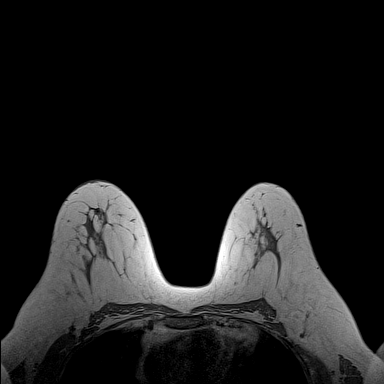
[im 108/144]
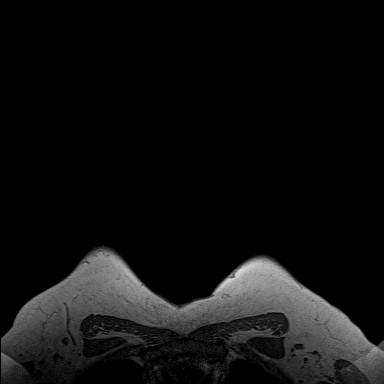
[im 144/144]
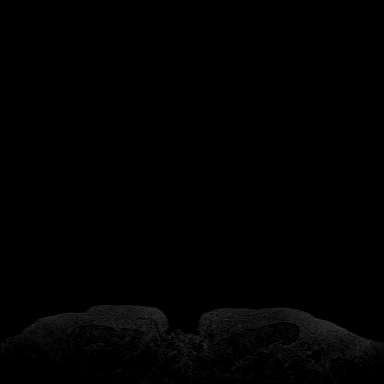

[Series 4: fl3d pre-cm · axial · non-contrast · 1.2mm · 0.94mm/px · z∈[-79,+92]mm · 5 of 144 slices shown]
[im 1/144]
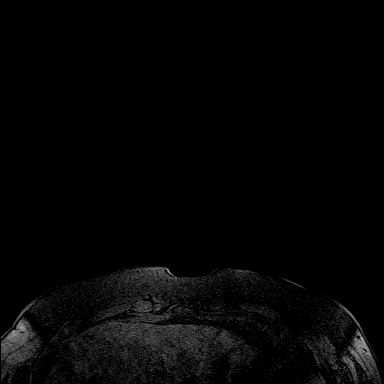
[im 36/144]
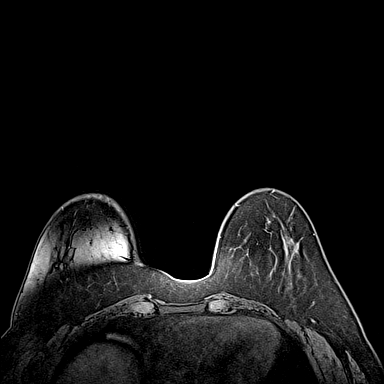
[im 72/144]
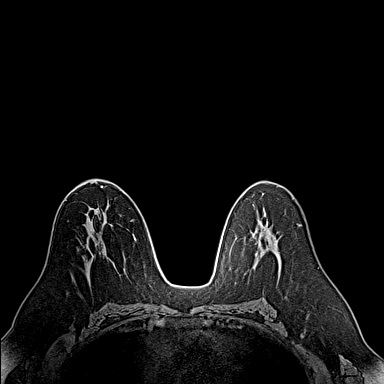
[im 108/144]
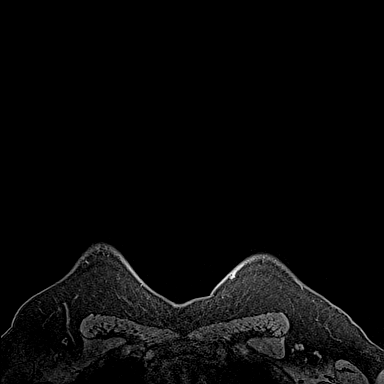
[im 144/144]
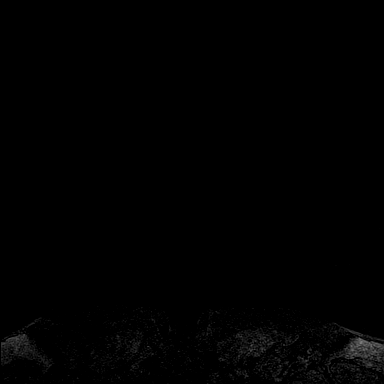

[Series 5: fl3d post-cm 20 · axial · 1.2mm · 0.94mm/px · z∈[-79,+92]mm · 5 of 144 slices shown (1 of 3)]
[im 1/144]
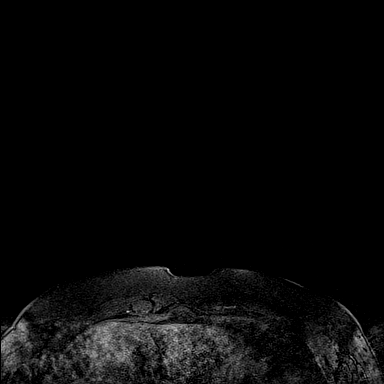
[im 36/144]
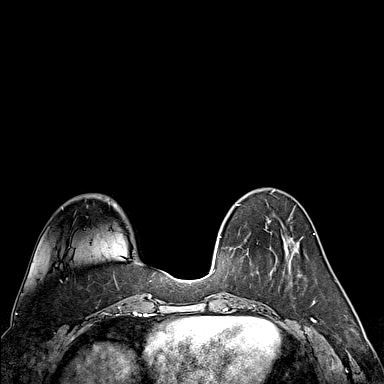
[im 72/144]
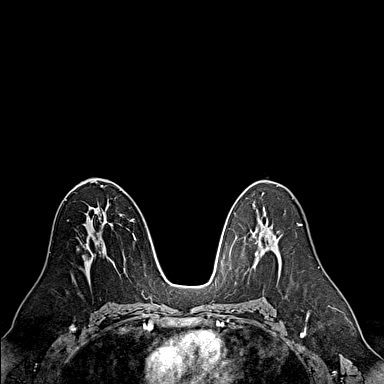
[im 108/144]
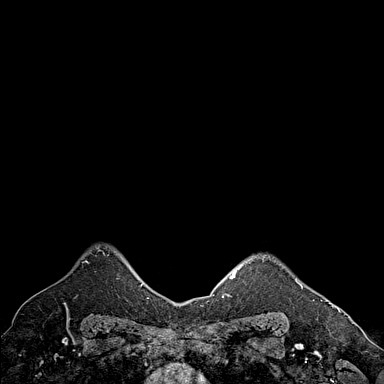
[im 144/144]
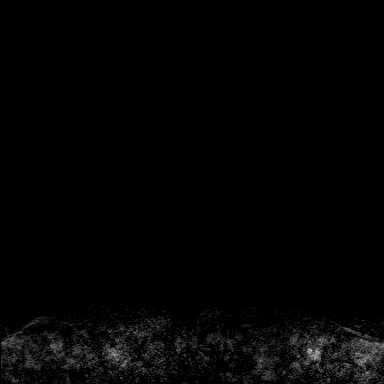

[Series 6: fl3d post-cm 20 · axial · 1.2mm · 0.94mm/px · z∈[-79,+92]mm · 5 of 144 slices shown (2 of 3)]
[im 1/144]
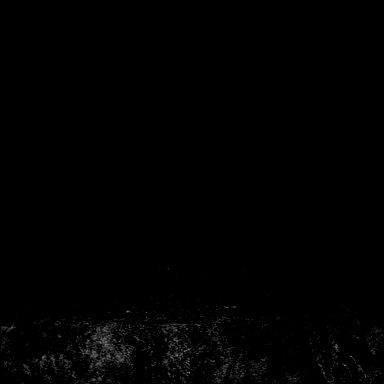
[im 36/144]
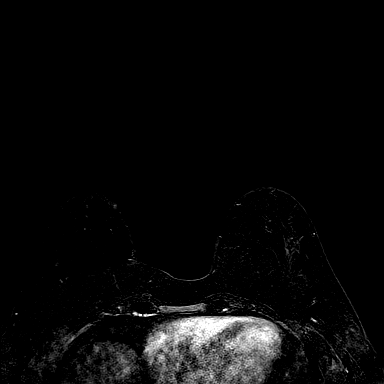
[im 72/144]
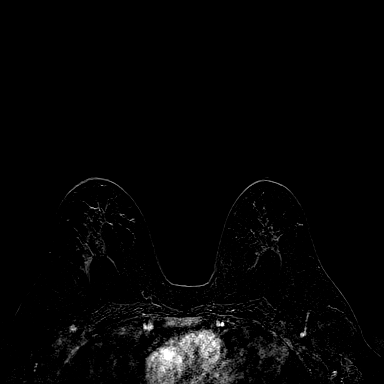
[im 108/144]
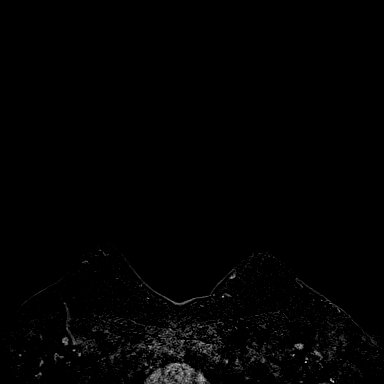
[im 144/144]
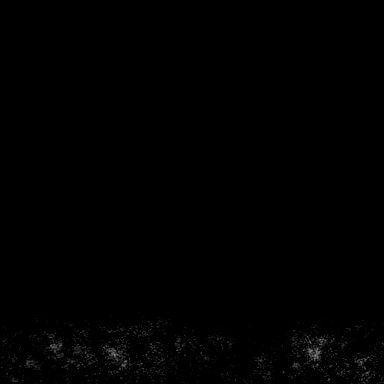

[Series 7: fl3d post-cm 20 · axial · 172.8mm · 0.94mm/px · 1 of 1 slices shown (3 of 3)]
[im 1/1]
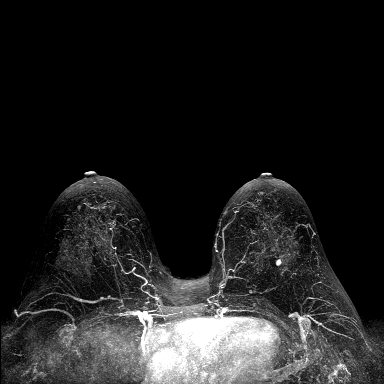

[Series 8: fl3d post-cm 3min · axial · 1.2mm · 0.94mm/px · z∈[-79,+92]mm · 6 of 144 slices shown]
[im 1/144]
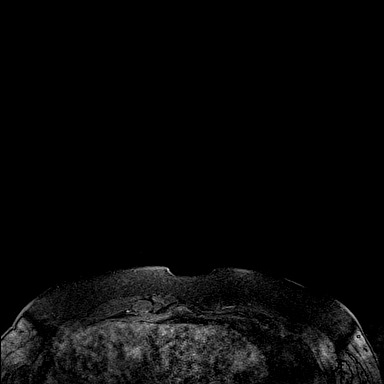
[im 29/144]
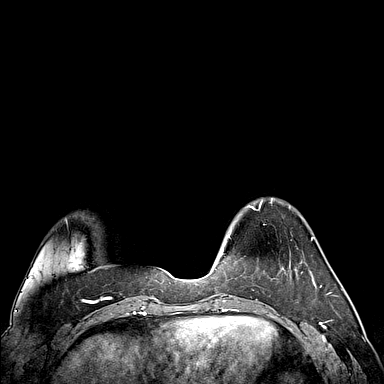
[im 58/144]
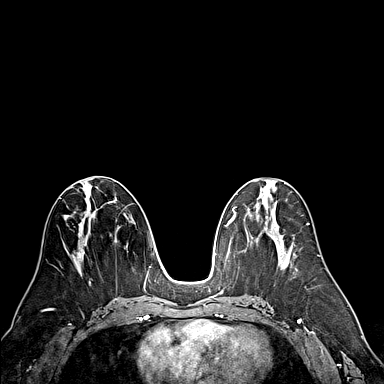
[im 86/144]
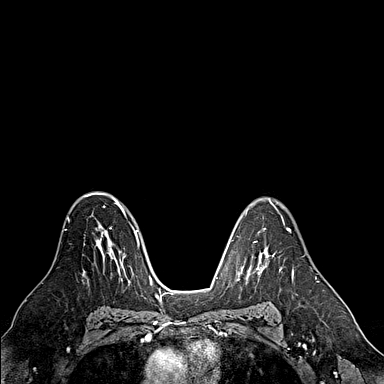
[im 115/144]
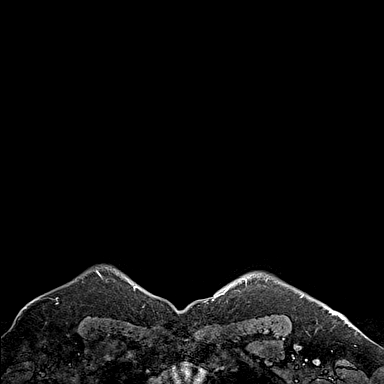
[im 144/144]
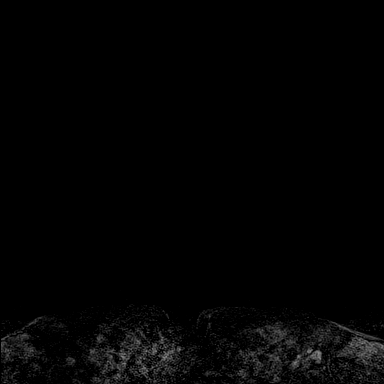

[Series 9: fl3d post-cm 3min_sub · axial · 1.2mm · 0.94mm/px · z∈[-79,+57]mm · 5 of 144 slices shown]
[im 1/144]
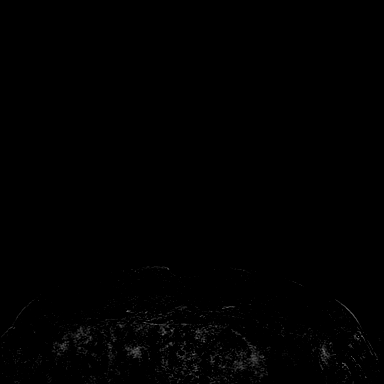
[im 29/144]
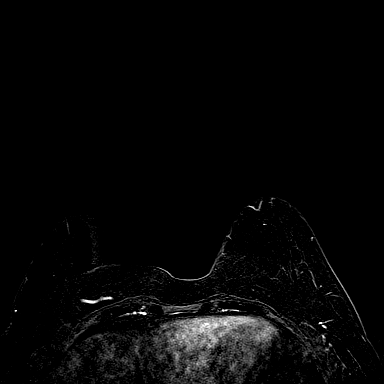
[im 58/144]
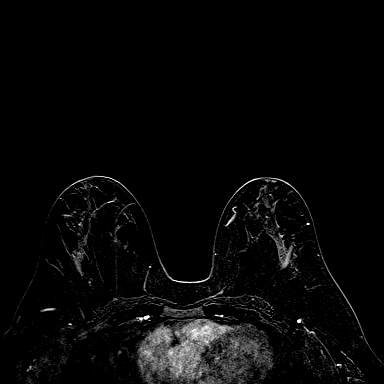
[im 86/144]
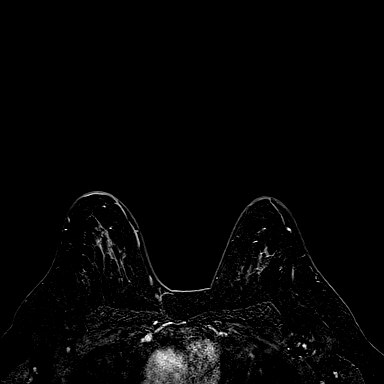
[im 115/144]
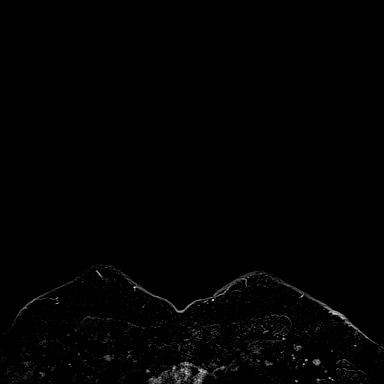

[33 of 48 positions shown; findings below may reference images not displayed]

Three-dimensional MR images were rendered by post-processing of the
original MR data on an independent workstation. The
three-dimensional MR images were interpreted, and findings are
reported in the following complete MRI report for this study. Three
dimensional images were evaluated at the independent [employer]
workstation
FINDINGS: Breast composition: b. Scattered fibroglandular tissue.

Background parenchymal enhancement: Moderate.

Right breast: No suspicious mass or abnormal enhancement.
Susceptibility artifact from post biopsy clip is noted in the lower
outer aspect at posterior depth at the site of previous benign
biopsy. No residual enhancement is seen at this site.

Left breast: No suspicious mass or abnormal enhancement.
Susceptibility artifact from post biopsy clip is noted in the lower
outer aspect at middle depth. This is approximately 2 cm
anterolateral to the prior biopsy site, which demonstrates a
residual 6 mm circumscribed, enhancing mass (previously 1.3 cm).

Lymph nodes: No abnormal appearing lymph nodes.

Ancillary findings:  None.
IMPRESSION: 1. No suspicious MRI findings in either breast.
2. Benign biopsy related changes bilaterally.

RECOMMENDATION:
Routine annual screening with mammography and breast MRI. The
patient is due for her next bilateral mammogram in [DATE].

BI-RADS CATEGORY  2: Benign.

## 2019-08-10 MED ORDER — GADOBUTROL 1 MMOL/ML IV SOLN
9.0000 mL | Freq: Once | INTRAVENOUS | Status: AC | PRN
  Administered 2019-08-10: 12:00:00 9 mL via INTRAVENOUS

## 2020-01-13 DIAGNOSIS — E039 Hypothyroidism, unspecified: Secondary | ICD-10-CM | POA: Insufficient documentation

## 2020-03-16 ENCOUNTER — Ambulatory Visit: Payer: 59 | Admitting: Family Medicine

## 2020-03-31 ENCOUNTER — Ambulatory Visit: Payer: 59 | Admitting: Family Medicine

## 2020-05-05 ENCOUNTER — Other Ambulatory Visit: Payer: Self-pay

## 2020-05-05 ENCOUNTER — Other Ambulatory Visit: Payer: 59

## 2020-05-05 DIAGNOSIS — Z20822 Contact with and (suspected) exposure to covid-19: Secondary | ICD-10-CM

## 2020-05-06 ENCOUNTER — Ambulatory Visit: Payer: 59 | Admitting: Family Medicine

## 2020-05-07 LAB — SARS-COV-2, NAA 2 DAY TAT

## 2020-05-07 LAB — NOVEL CORONAVIRUS, NAA: SARS-CoV-2, NAA: DETECTED — AB

## 2020-05-11 ENCOUNTER — Ambulatory Visit: Payer: 59 | Admitting: Family Medicine

## 2020-06-20 NOTE — Patient Instructions (Addendum)
Health Maintenance Due  Topic Date Due  . Hepatitis C Screening  Never done  . COVID-19 Vaccine (1) Never done  . HIV Screening  Never done  . TETANUS/TDAP  Never done  . PAP SMEAR-Modifier  Never done  . COLONOSCOPY (Pts 45-67yrs Insurance coverage will need to be confirmed)  Never done  . INFLUENZA VACCINE  Never done    No flowsheet data found.  Check BP at home 3-4x/wk x 2 wks and then you can send me a MyChart message or call with readings Goal is under 140 / under 90 Pt has been trying to limit sodium intake

## 2020-06-22 ENCOUNTER — Encounter: Payer: Self-pay | Admitting: Family Medicine

## 2020-06-22 ENCOUNTER — Ambulatory Visit: Payer: 59 | Admitting: Family Medicine

## 2020-06-22 ENCOUNTER — Other Ambulatory Visit: Payer: Self-pay

## 2020-06-22 VITALS — BP 134/86 | HR 105 | Temp 98.4°F | Ht 63.0 in | Wt 208.4 lb

## 2020-06-22 DIAGNOSIS — B373 Candidiasis of vulva and vagina: Secondary | ICD-10-CM

## 2020-06-22 DIAGNOSIS — N912 Amenorrhea, unspecified: Secondary | ICD-10-CM | POA: Insufficient documentation

## 2020-06-22 DIAGNOSIS — R0609 Other forms of dyspnea: Secondary | ICD-10-CM

## 2020-06-22 DIAGNOSIS — R0600 Dyspnea, unspecified: Secondary | ICD-10-CM

## 2020-06-22 DIAGNOSIS — E039 Hypothyroidism, unspecified: Secondary | ICD-10-CM | POA: Diagnosis not present

## 2020-06-22 DIAGNOSIS — R5383 Other fatigue: Secondary | ICD-10-CM | POA: Diagnosis not present

## 2020-06-22 DIAGNOSIS — R030 Elevated blood-pressure reading, without diagnosis of hypertension: Secondary | ICD-10-CM

## 2020-06-22 DIAGNOSIS — Z7689 Persons encountering health services in other specified circumstances: Secondary | ICD-10-CM

## 2020-06-22 DIAGNOSIS — B3731 Acute candidiasis of vulva and vagina: Secondary | ICD-10-CM

## 2020-06-22 DIAGNOSIS — Z6836 Body mass index (BMI) 36.0-36.9, adult: Secondary | ICD-10-CM | POA: Insufficient documentation

## 2020-06-22 MED ORDER — ALBUTEROL SULFATE HFA 108 (90 BASE) MCG/ACT IN AERS: 2.0000 | INHALATION_SPRAY | Freq: Four times a day (QID) | RESPIRATORY_TRACT | 1 refills | Status: DC | PRN

## 2020-06-22 MED ORDER — FLUCONAZOLE 150 MG PO TABS: ORAL_TABLET | ORAL | 0 refills | Status: AC

## 2020-06-22 NOTE — Progress Notes (Signed)
Autumn Hall is a 50 y.o. female  Chief Complaint  Patient presents with  . Establish Care    New patient/establish care. Patient would like to discuss elevated blood pressure, would like something to treat possible yeast patient currently on antibiotics to cause this. States that she becomes SOB a lot.     HPI: Autumn Hall is a 50 y.o. female seen today to establish care with our office. She has not had a PCP in the past 10 years.  She follows GYN - Dr. Elon Spanner  She is concerned about elevated BP readings at GYN office. She has BP cuff at home and checks her BP but sporadically.   Also pt is on augmentin ahead of dental procedure and feels she may be getting a yeast infection. Vaginal itching, discharge.    She notes seasonal allergies as a child. Has not had allergies as an adult.   She complains of SOB with exertion but this is not consistent. Not daily and not every time she exerts herself. Maybe 2-3 times per week and some weeks less. She feels a tightness in her face, not in her chest. No wheeze. No dizziness. No n/v. No CP. No edema. No orthopnea.  Pt states she has been told by others that she "sighs a lot". She also feels at times like she has, out of nowhere" to take a deep breath. She does feel like she gets anxious and overwhelmed.  No regular exercise Pt sleeps well at night.  Denies depression.   Past Medical History:  Diagnosis Date  . Thyroid disease     Past Surgical History:  Procedure Laterality Date  . COLONOSCOPY      Social History   Socioeconomic History  . Marital status: Married    Spouse name: Not on file  . Number of children: Not on file  . Years of education: Not on file  . Highest education level: Not on file  Occupational History  . Not on file  Tobacco Use  . Smoking status: Never Smoker  . Smokeless tobacco: Never Used  Vaping Use  . Vaping Use: Never used  Substance and Sexual Activity  . Alcohol use: Yes    Comment:  rare   . Drug use: Never  . Sexual activity: Yes  Other Topics Concern  . Not on file  Social History Narrative  . Not on file   Social Determinants of Health   Financial Resource Strain: Not on file  Food Insecurity: Not on file  Transportation Needs: Not on file  Physical Activity: Not on file  Stress: Not on file  Social Connections: Not on file  Intimate Partner Violence: Not on file    Family History  Problem Relation Age of Onset  . Breast cancer Sister 52  . Cancer Sister   . Healthy Mother   . Diabetes Father   . Hyperlipidemia Father   . Hypertension Father      There is no immunization history on file for this patient.  Outpatient Encounter Medications as of 06/22/2020  Medication Sig  . albuterol (VENTOLIN HFA) 108 (90 Base) MCG/ACT inhaler Inhale 2 puffs into the lungs every 6 (six) hours as needed for wheezing or shortness of breath.  Marland Kitchen amoxicillin-clavulanate (AUGMENTIN) 500-125 MG tablet Take 1 tablet by mouth 3 (three) times daily.  . fluconazole (DIFLUCAN) 150 MG tablet Take 1 tab po x 1 then repeat in 48-72  . ibuprofen (ADVIL) 800 MG tablet ibuprofen 800 mg  tablet  . levothyroxine (SYNTHROID) 50 MCG tablet levothyroxine 50 mcg tablet  TAKE 1 TABLET BY MOUTH EVERYDAY AT 6 AM   No facility-administered encounter medications on file as of 06/22/2020.     ROS: Pertinent positives and negatives noted in HPI. Remainder of ROS non-contributory  Allergies  Allergen Reactions  . No Known Allergies     BP 134/86   Pulse (!) 105   Temp 98.4 F (36.9 C) (Temporal)   Ht 5\' 3"  (1.6 m)   Wt 208 lb 6.4 oz (94.5 kg)   SpO2 99%   BMI 36.92 kg/m   Physical Exam Constitutional:      General: She is not in acute distress.    Appearance: Normal appearance. She is well-developed and well-nourished.  HENT:     Head: Normocephalic and atraumatic.     Mouth/Throat:     Mouth: Oropharynx is clear and moist and mucous membranes are normal.  Neck:     Thyroid:  No thyromegaly.  Cardiovascular:     Rate and Rhythm: Normal rate and regular rhythm.     Pulses: Intact distal pulses.     Heart sounds: Normal heart sounds. No murmur heard.   Pulmonary:     Effort: Pulmonary effort is normal. No respiratory distress.     Breath sounds: Normal breath sounds. No wheezing or rhonchi.  Musculoskeletal:        General: No edema.     Cervical back: Neck supple.     Right lower leg: No edema.     Left lower leg: No edema.  Lymphadenopathy:     Cervical: No cervical adenopathy.  Neurological:     Mental Status: She is alert and oriented to person, place, and time.     Motor: No abnormal muscle tone.     Coordination: Coordination normal.  Psychiatric:        Mood and Affect: Mood and affect normal.        Behavior: Behavior normal.      A/P:  1. Encounter to establish care with new doctor  2. Elevated BP without diagnosis of hypertension - BP here today is WNL - pt to check BP at home 3-4x/wk x 2 wks and call or send MyChart message with readings. If average  >140/>90, will have pt schedule appt to discuss initiation of anti-HTN med - low sodium diet, less soda, more walking/CV exercise  3. Vaginal yeast infection Rx: - fluconazole (DIFLUCAN) 150 MG tablet; Take 1 tab po x 1 then repeat in 48-72  Dispense: 2 tablet; Refill: 0 - f/u if symptoms worsen or do not improve in 7-10 days   4. Fatigue, unspecified type - CBC - Vitamin B12 - Iron, TIBC and Ferritin Panel - T4, free - TSH  5. Hypothyroidism, unspecified type - on levothyroxine 9-10 daily - T4, free - TSH  6. DOE (dyspnea on exertion) - not consistent or often  - ? Related to seasonal allergy symptoms vs anxiety vs less likely exercise-induced asthma Rx: - albuterol (VENTOLIN HFA) 108 (90 Base) MCG/ACT inhaler; Inhale 2 puffs into the lungs every 6 (six) hours as needed for wheezing or shortness of breath.  Dispense: 8 g; Refill: 1 - f/u if symptoms worsen or do not improve  with above in 3-4wks   This visit occurred during the SARS-CoV-2 public health emergency.  Safety protocols were in place, including screening questions prior to the visit, additional usage of staff PPE, and extensive cleaning of exam room while  observing appropriate contact time as indicated for disinfecting solutions.

## 2020-06-23 LAB — CBC
HCT: 33.1 % — ABNORMAL LOW (ref 36.0–46.0)
Hemoglobin: 10.7 g/dL — ABNORMAL LOW (ref 12.0–15.0)
MCHC: 32.2 g/dL (ref 30.0–36.0)
MCV: 75.7 fl — ABNORMAL LOW (ref 78.0–100.0)
Platelets: 353 10*3/uL (ref 150.0–400.0)
RBC: 4.37 Mil/uL (ref 3.87–5.11)
RDW: 18.4 % — ABNORMAL HIGH (ref 11.5–15.5)
WBC: 8.4 10*3/uL (ref 4.0–10.5)

## 2020-06-23 LAB — T4, FREE: Free T4: 1.02 ng/dL (ref 0.60–1.60)

## 2020-06-23 LAB — IRON,TIBC AND FERRITIN PANEL
%SAT: 6 % (calc) — ABNORMAL LOW (ref 16–45)
Ferritin: 4 ng/mL — ABNORMAL LOW (ref 16–232)
Iron: 25 ug/dL — ABNORMAL LOW (ref 40–190)
TIBC: 402 mcg/dL (calc) (ref 250–450)

## 2020-06-23 LAB — VITAMIN B12: Vitamin B-12: 636 pg/mL (ref 211–911)

## 2020-06-23 LAB — TSH: TSH: 1.38 u[IU]/mL (ref 0.35–4.50)

## 2020-06-28 ENCOUNTER — Encounter: Payer: Self-pay | Admitting: Family Medicine

## 2020-06-28 DIAGNOSIS — D508 Other iron deficiency anemias: Secondary | ICD-10-CM

## 2020-06-28 NOTE — Telephone Encounter (Signed)
Patient has sent message asking for lab results.  Please review and advise.   Thanks. Ian Bushman, cma

## 2020-10-25 ENCOUNTER — Other Ambulatory Visit: Payer: Self-pay | Admitting: Family Medicine

## 2020-10-25 DIAGNOSIS — R0600 Dyspnea, unspecified: Secondary | ICD-10-CM

## 2020-10-25 DIAGNOSIS — R0609 Other forms of dyspnea: Secondary | ICD-10-CM

## 2021-06-09 ENCOUNTER — Other Ambulatory Visit: Payer: Self-pay | Admitting: Obstetrics and Gynecology

## 2021-06-09 DIAGNOSIS — Z9189 Other specified personal risk factors, not elsewhere classified: Secondary | ICD-10-CM

## 2021-07-18 ENCOUNTER — Ambulatory Visit
Admission: RE | Admit: 2021-07-18 | Discharge: 2021-07-18 | Disposition: A | Discharge status: Home / Self Care | Payer: Self-pay | Source: Ambulatory Visit | Attending: Obstetrics and Gynecology | Admitting: Obstetrics and Gynecology

## 2021-07-18 DIAGNOSIS — Z9189 Other specified personal risk factors, not elsewhere classified: Secondary | ICD-10-CM

## 2021-07-18 IMAGING — MR MR BREAST BILAT WO/W CM
8 of 12 series · 33 of 48 positions shown · IV contrast (10 ml gadavist)
Comparison: MRI on [DATE] and screening mammogram on [DATE]

CLINICAL DATA: High risk screening. Patient is positive for the
[QE] mutation. Family history of breast cancer diagnosed in her
sister at 48 and her paternal aunt at age 70. Patient had MR guided
core biopsy in [DATE], demonstrating benign fibroadenoma in
the LOWER OUTER QUADRANT of the LEFT breast and fibrocystic changes
in the LOWER OUTER QUADRANT of the RIGHT breast.

EXAM:
BILATERAL BREAST MRI WITH AND WITHOUT CONTRAST
TECHNIQUE: Multiplanar, multisequence MR images of both breasts were obtained
prior to and following the intravenous administration of 10 ml of
Gadavist

[Series 2: t2_tirm_tra ipat (a-p) · axial · 3.0mm · 0.70mm/px · 1 of 47 slices shown]
[im 1/47]
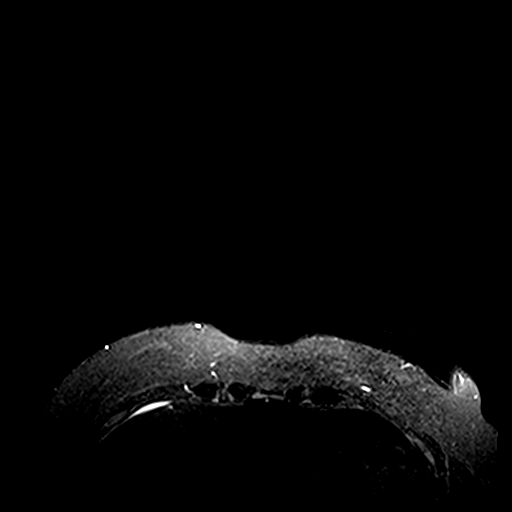

[Series 3: fl3d pre-cm no · axial · non-contrast · 1.2mm · 0.94mm/px · z∈[-22,+120]mm · 5 of 120 slices shown]
[im 1/120]
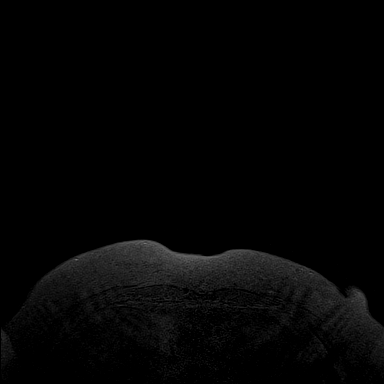
[im 30/120]
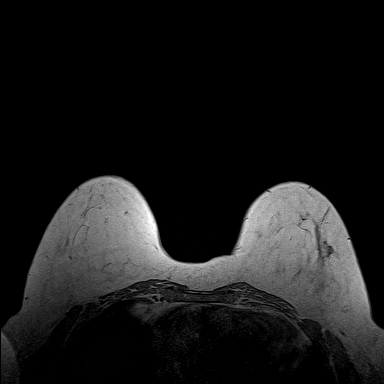
[im 60/120]
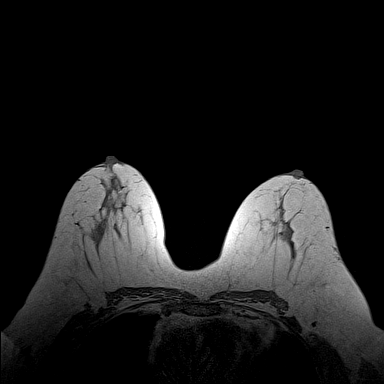
[im 90/120]
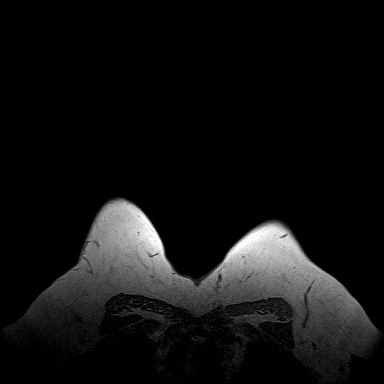
[im 120/120]
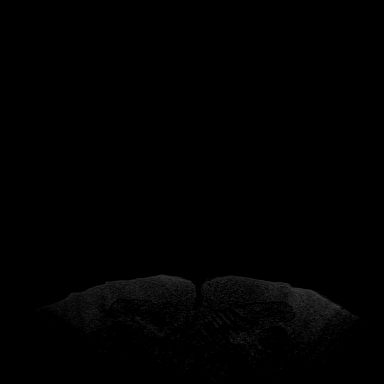

[Series 4: fl3d pre-cm · axial · non-contrast · 1.2mm · 0.94mm/px · z∈[-22,+120]mm · 5 of 120 slices shown]
[im 1/120]
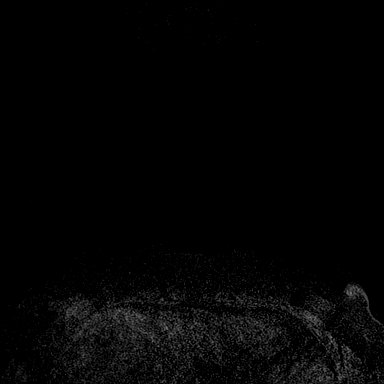
[im 30/120]
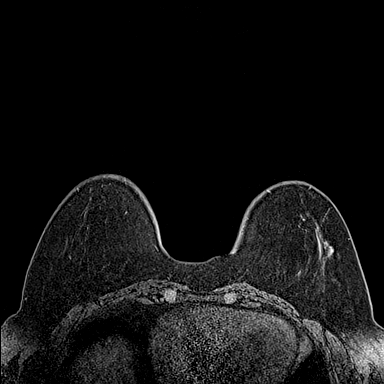
[im 60/120]
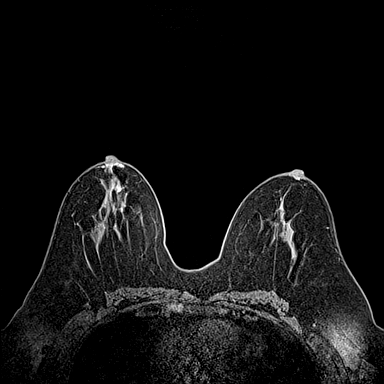
[im 90/120]
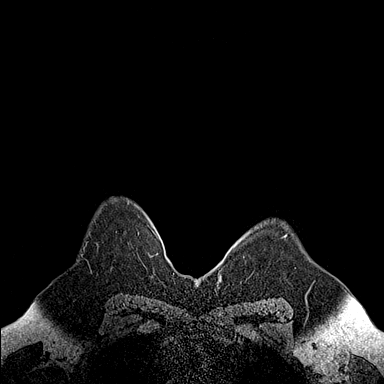
[im 120/120]
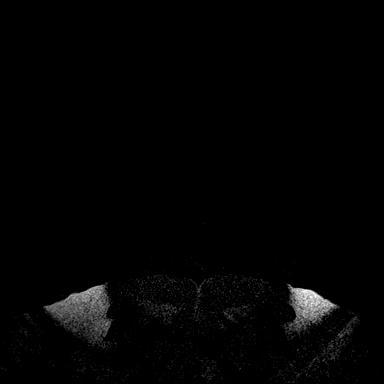

[Series 5: fl3d post-cm 20 · axial · 1.2mm · 0.94mm/px · z∈[-22,+120]mm · 5 of 120 slices shown (1 of 3)]
[im 1/120]
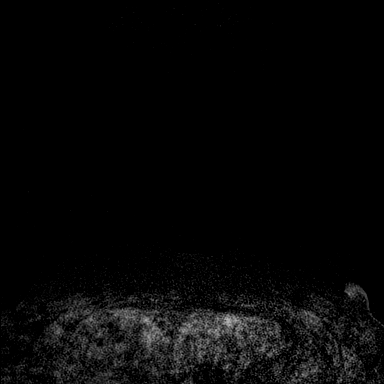
[im 30/120]
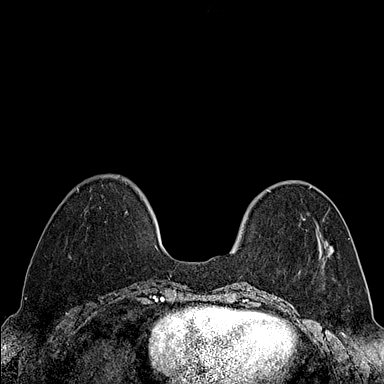
[im 60/120]
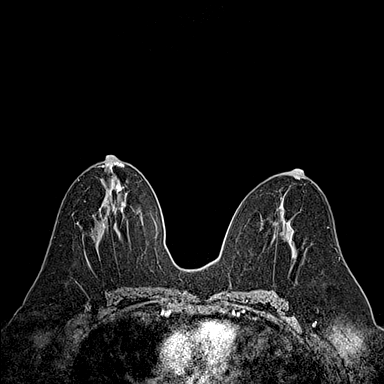
[im 90/120]
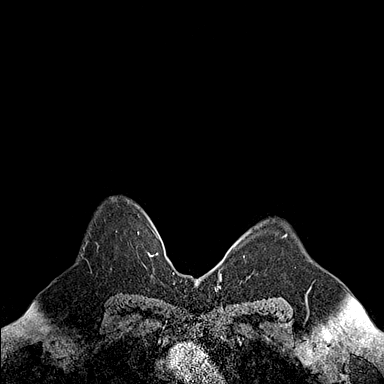
[im 120/120]
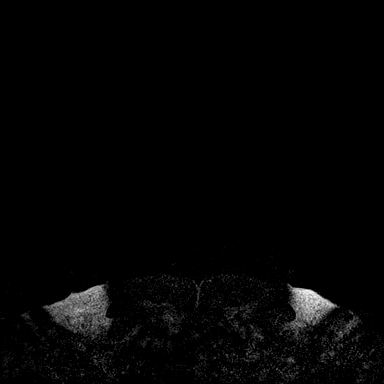

[Series 6: fl3d post-cm 20 · axial · 1.2mm · 0.94mm/px · z∈[-22,+120]mm · 5 of 120 slices shown (2 of 3)]
[im 1/120]
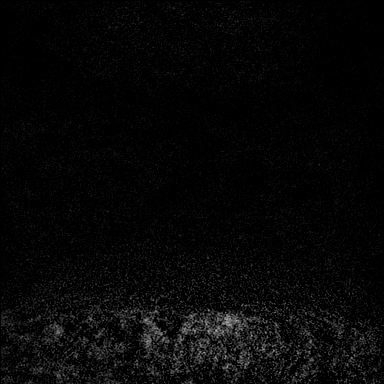
[im 30/120]
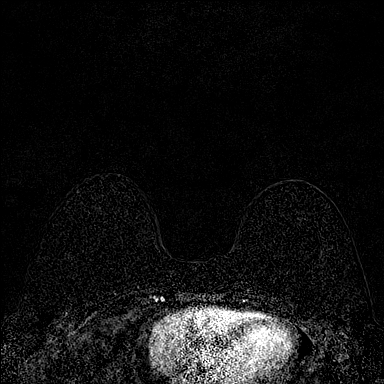
[im 60/120]
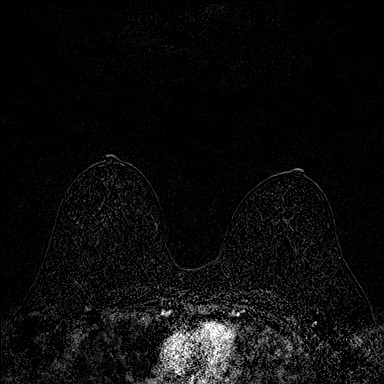
[im 90/120]
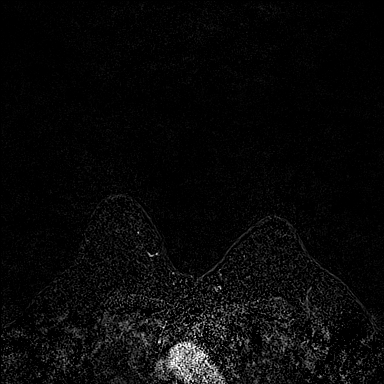
[im 120/120]
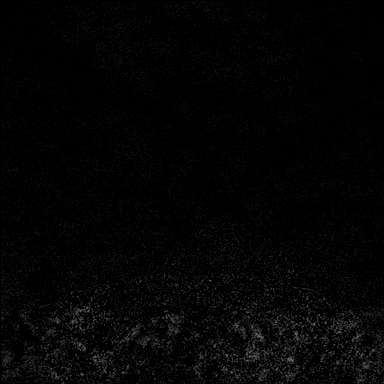

[Series 7: fl3d post-cm 20 · axial · 144.0mm · 0.94mm/px · 1 of 1 slices shown (3 of 3)]
[im 1/1]
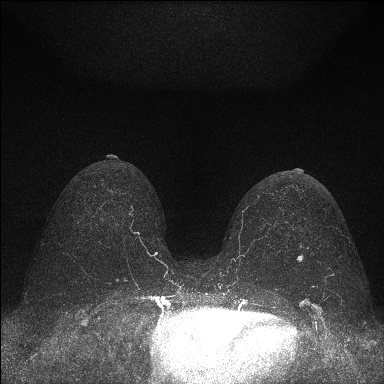

[Series 8: fl3d post-cm 3 · axial · 1.2mm · 0.94mm/px · z∈[-22,+120]mm · 6 of 120 slices shown (1 of 2)]
[im 1/120]
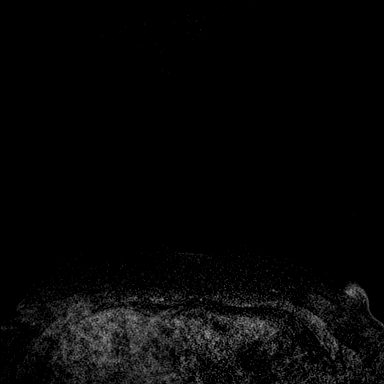
[im 24/120]
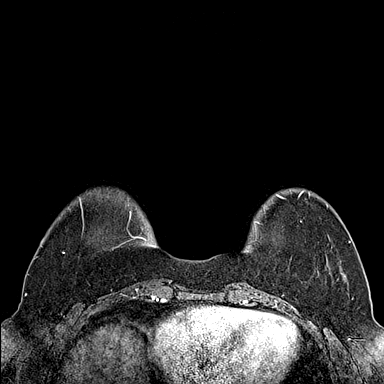
[im 48/120]
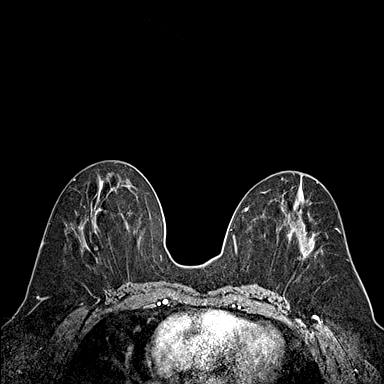
[im 72/120]
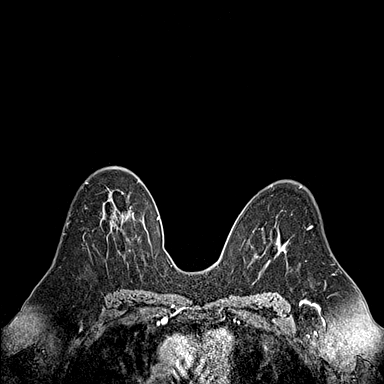
[im 96/120]
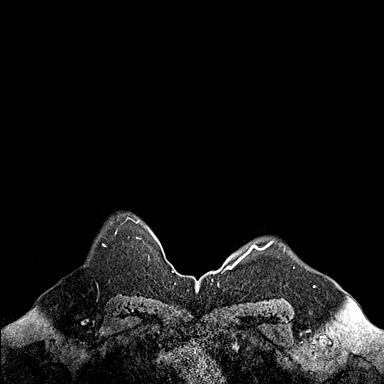
[im 120/120]
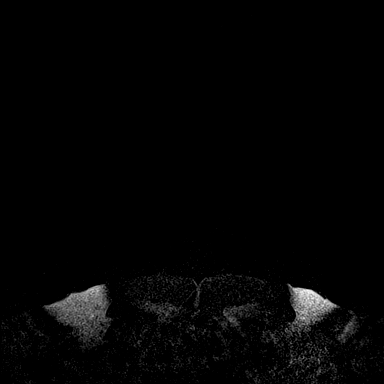

[Series 9: fl3d post-cm 3 · axial · 1.2mm · 0.94mm/px · z∈[-22,+92]mm · 5 of 120 slices shown (2 of 2)]
[im 1/120]
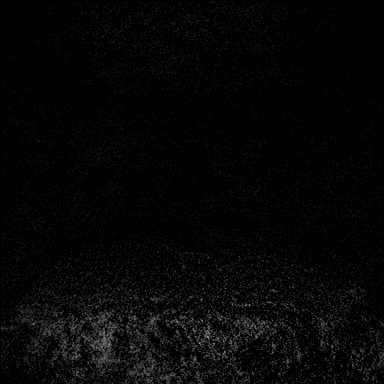
[im 24/120]
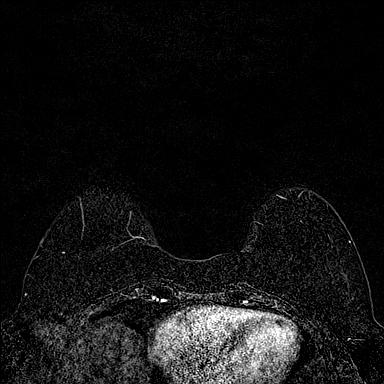
[im 48/120]
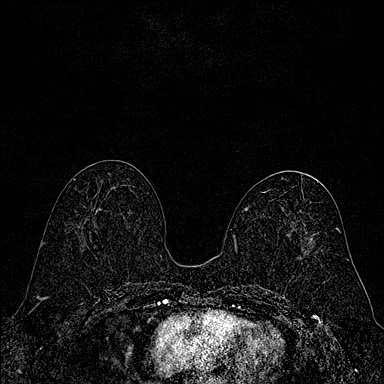
[im 72/120]
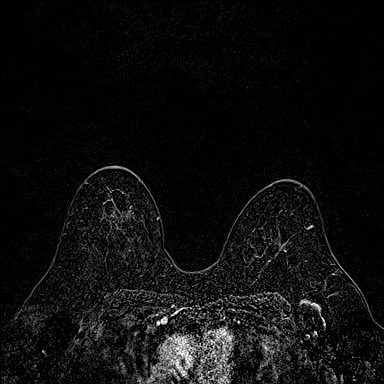
[im 96/120]
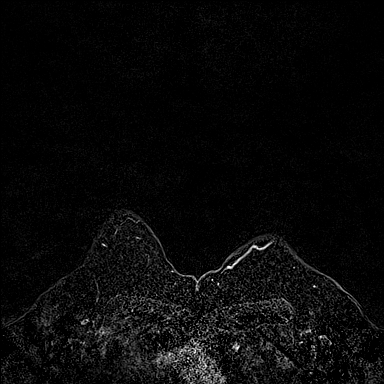

[33 of 48 positions shown; findings below may reference images not displayed]

Three-dimensional MR images were rendered by post-processing of the
original MR data on an independent workstation. The
three-dimensional MR images were interpreted, and findings are
reported in the following complete MRI report for this study. Three
dimensional images were evaluated at the independent interpreting
workstation using the [employer] thin client.
FINDINGS: Breast composition: b. Scattered fibroglandular tissue.

Background parenchymal enhancement: Minimal

Right breast: No mass or abnormal enhancement.

Left breast: Within the posterior LOWER OUTER QUADRANT of the LEFT
breast, there is a stable oval mass with persistent kinetics
measuring 0.7 centimeters and unchanged. This represents a MRI
guided biopsy-proven fibroadenoma. No new or suspicious findings in
the LEFT breast.

Lymph nodes: No abnormal appearing lymph nodes.

Ancillary findings:  None.
IMPRESSION: 1. No MRI evidence for malignancy.
2. Stable biopsy-proven fibroadenoma in the posterior LOWER OUTER
QUADRANT of the LEFT breast.

RECOMMENDATION:
Recommend screening mammogram in [DATE].

Recommend annual screening MRI given the patient's [QE] mutation.

BI-RADS CATEGORY  2: Benign.

## 2021-07-18 MED ORDER — GADOBUTROL 1 MMOL/ML IV SOLN
10.0000 mL | Freq: Once | INTRAVENOUS | Status: AC | PRN
  Administered 2021-07-18: 10 mL via INTRAVENOUS

## 2022-10-19 ENCOUNTER — Other Ambulatory Visit: Payer: Self-pay | Admitting: Obstetrics and Gynecology

## 2022-10-19 DIAGNOSIS — Z9189 Other specified personal risk factors, not elsewhere classified: Secondary | ICD-10-CM

## 2023-02-06 ENCOUNTER — Ambulatory Visit
Admission: RE | Admit: 2023-02-06 | Discharge: 2023-02-06 | Disposition: A | Discharge status: Home / Self Care | Payer: Managed Care, Other (non HMO) | Source: Ambulatory Visit | Attending: Obstetrics and Gynecology | Admitting: Obstetrics and Gynecology

## 2023-02-06 DIAGNOSIS — Z9189 Other specified personal risk factors, not elsewhere classified: Secondary | ICD-10-CM

## 2023-02-06 MED ORDER — GADOPICLENOL 0.5 MMOL/ML IV SOLN
9.0000 mL | Freq: Once | INTRAVENOUS | Status: AC | PRN
  Administered 2023-02-06: 9 mL via INTRAVENOUS

## 2023-12-02 ENCOUNTER — Other Ambulatory Visit: Payer: Self-pay | Admitting: Obstetrics and Gynecology

## 2023-12-02 DIAGNOSIS — Z9189 Other specified personal risk factors, not elsewhere classified: Secondary | ICD-10-CM

## 2024-03-18 ENCOUNTER — Inpatient Hospital Stay: Admission: RE | Admit: 2024-03-18 | Discharge: 2024-03-18 | Attending: Obstetrics and Gynecology

## 2024-03-18 DIAGNOSIS — Z9189 Other specified personal risk factors, not elsewhere classified: Secondary | ICD-10-CM

## 2024-03-18 MED ORDER — GADOPICLENOL 0.5 MMOL/ML IV SOLN
9.0000 mL | Freq: Once | INTRAVENOUS | Status: AC | PRN
  Administered 2024-03-18: 9 mL via INTRAVENOUS
